# Patient Record
Sex: Female | Born: 1980 | Race: Black or African American | Hispanic: No | Marital: Single | Smoking: Former smoker
Health system: Southern US, Community
[De-identification: ages and names within clinical notes are randomized; demographics above are authoritative.]

## PROBLEM LIST (undated history)

## (undated) DIAGNOSIS — R5383 Other fatigue: Secondary | ICD-10-CM

## (undated) DIAGNOSIS — F329 Major depressive disorder, single episode, unspecified: Secondary | ICD-10-CM

## (undated) DIAGNOSIS — O09522 Supervision of elderly multigravida, second trimester: Secondary | ICD-10-CM

## (undated) DIAGNOSIS — R55 Syncope and collapse: Secondary | ICD-10-CM

## (undated) DIAGNOSIS — R0602 Shortness of breath: Secondary | ICD-10-CM

## (undated) DIAGNOSIS — Z3A16 16 weeks gestation of pregnancy: Secondary | ICD-10-CM

## (undated) DIAGNOSIS — S0990XS Unspecified injury of head, sequela: Secondary | ICD-10-CM

## (undated) DIAGNOSIS — R002 Palpitations: Secondary | ICD-10-CM

## (undated) DIAGNOSIS — F32A Depression, unspecified: Secondary | ICD-10-CM

## (undated) DIAGNOSIS — G44309 Post-traumatic headache, unspecified, not intractable: Secondary | ICD-10-CM

## (undated) HISTORY — PX: HERNIA REPAIR: SHX51

## (undated) HISTORY — DX: Depression, unspecified: F32.A

## (undated) HISTORY — DX: Other fatigue: R53.83

## (undated) HISTORY — DX: Major depressive disorder, single episode, unspecified: F32.9

## (undated) HISTORY — DX: Syncope and collapse: R55

## (undated) HISTORY — DX: Shortness of breath: R06.02

## (undated) HISTORY — DX: Palpitations: R00.2

## (undated) HISTORY — DX: Unspecified injury of head, sequela: S09.90XS

## (undated) HISTORY — DX: Supervision of elderly multigravida, second trimester: O09.522

## (undated) HISTORY — DX: Unspecified injury of head, sequela: G44.309

## (undated) HISTORY — DX: 16 weeks gestation of pregnancy: Z3A.16

---

## 2015-08-26 ENCOUNTER — Encounter (HOSPITAL_COMMUNITY): Payer: Self-pay | Admitting: *Deleted

## 2015-08-26 ENCOUNTER — Emergency Department (INDEPENDENT_AMBULATORY_CARE_PROVIDER_SITE_OTHER)
Admission: EM | Admit: 2015-08-26 | Discharge: 2015-08-26 | Disposition: A | Discharge status: Home / Self Care | Payer: Medicaid Other | Source: Home / Self Care | Attending: Family Medicine | Admitting: Family Medicine

## 2015-08-26 DIAGNOSIS — K529 Noninfective gastroenteritis and colitis, unspecified: Secondary | ICD-10-CM

## 2015-08-26 LAB — POCT URINALYSIS DIP (DEVICE)
Bilirubin Urine: NEGATIVE
GLUCOSE, UA: NEGATIVE mg/dL
Hgb urine dipstick: NEGATIVE
KETONES UR: NEGATIVE mg/dL
Nitrite: NEGATIVE
PROTEIN: NEGATIVE mg/dL
SPECIFIC GRAVITY, URINE: 1.025 (ref 1.005–1.030)
UROBILINOGEN UA: 2 mg/dL — AB (ref 0.0–1.0)
pH: 6.5 (ref 5.0–8.0)

## 2015-08-26 LAB — POCT PREGNANCY, URINE: PREG TEST UR: NEGATIVE

## 2015-08-26 MED ORDER — ONDANSETRON 4 MG PO TBDP
4.0000 mg | ORAL_TABLET | Freq: Once | ORAL | Status: AC
  Administered 2015-08-26: 4 mg via ORAL

## 2015-08-26 MED ORDER — PROMETHAZINE HCL 25 MG PO TABS: 25.0000 mg | ORAL_TABLET | Freq: Four times a day (QID) | ORAL | Status: DC | PRN

## 2015-08-26 MED ORDER — ONDANSETRON 4 MG PO TBDP
ORAL_TABLET | ORAL | Status: AC
  Filled 2015-08-26: qty 1

## 2015-08-26 NOTE — Discharge Instructions (Signed)
Clear liquid , bland diet tonight as tolerated, advance on fri as improved, use medicine as needed,imodium for diarrhea as needed, return or see your doctor if any problems.

## 2015-08-26 NOTE — ED Provider Notes (Signed)
CSN: [id number]     Arrival date & time 08/26/15  1634 History   First MD Initiated Contact with Patient 08/26/15 1718     Chief Complaint  Patient presents with  . Nausea   (Consider location/radiation/quality/duration/timing/severity/associated sxs/prior Treatment) Patient is a 34 y.o. female presenting with vomiting. The history is provided by the patient.  Emesis Severity:  Mild Duration:  1 day Number of daily episodes:  2 Quality:  Stomach contents Progression:  Partially resolved Chronicity:  New Worsened by:  Nothing tried Ineffective treatments:  None tried Associated symptoms: no abdominal pain, no chills, no diarrhea and no fever   Risk factors: sick contacts     History reviewed. No pertinent past medical history. History reviewed. No pertinent past surgical history. No family history on file. Social History  Substance Use Topics  . Smoking status: Current Some Day Smoker  . Smokeless tobacco: None  . Alcohol Use: Yes   OB History    No data available     Review of Systems  Constitutional: Negative.  Negative for fever and chills.  HENT: Negative.   Cardiovascular: Negative.   Gastrointestinal: Positive for nausea and vomiting. Negative for abdominal pain, diarrhea and blood in stool.    Allergies  Review of patient's allergies indicates no known allergies.  Home Medications   Prior to Admission medications   Medication Sig Start Date End Date Taking? Authorizing Provider  promethazine (PHENERGAN) 25 MG tablet Take 1 tablet (25 mg total) by mouth every 6 (six) hours as needed for nausea or vomiting. 08/26/15   Linna Hoff, MD   Meds Ordered and Administered this Visit   Medications  ondansetron (ZOFRAN-ODT) disintegrating tablet 4 mg (4 mg Oral Given 08/26/15 1752)    BP 128/96 mmHg  Pulse 84  Temp(Src) 98 F (36.7 C) (Oral)  Resp 16  SpO2 100%  LMP  No data found.   Physical Exam  Constitutional: She is oriented to person, place, and  time. She appears well-developed and well-nourished. No distress.  HENT:  Mouth/Throat: Oropharynx is clear and moist.  Neck: Normal range of motion. Neck supple.  Cardiovascular: Normal heart sounds.   Abdominal: Soft. Bowel sounds are normal. She exhibits no distension and no mass. There is no tenderness. There is no rebound and no guarding.  Lymphadenopathy:    She has no cervical adenopathy.  Neurological: She is alert and oriented to person, place, and time.  Skin: Skin is warm and dry.  Nursing note and vitals reviewed.   ED Course  Procedures (including critical care time)  Labs Review Labs Reviewed  POCT URINALYSIS DIP (DEVICE) - Abnormal; Notable for the following:    Urobilinogen, UA 2.0 (*)    Leukocytes, UA SMALL (*)    All other components within normal limits  POCT PREGNANCY, URINE    Imaging Review No results found.   Visual Acuity Review  Right Eye Distance:   Left Eye Distance:   Bilateral Distance:    Right Eye Near:   Left Eye Near:    Bilateral Near:         MDM   1. Gastroenteritis, acute       Linna Hoff, MD 08/27/15 [phone]

## 2015-08-26 NOTE — ED Notes (Signed)
Pt  Reports   Symptoms  Of      Nausea       She  Is  Late  On  Her   Cycle          Child  Has  Diarrhea   With onset  Today

## 2015-11-17 ENCOUNTER — Emergency Department (INDEPENDENT_AMBULATORY_CARE_PROVIDER_SITE_OTHER)
Admission: EM | Admit: 2015-11-17 | Discharge: 2015-11-17 | Disposition: A | Discharge status: Home / Self Care | Payer: Medicaid Other | Source: Home / Self Care | Attending: Emergency Medicine | Admitting: Emergency Medicine

## 2015-11-17 ENCOUNTER — Encounter (HOSPITAL_COMMUNITY): Payer: Self-pay | Admitting: Emergency Medicine

## 2015-11-17 DIAGNOSIS — K529 Noninfective gastroenteritis and colitis, unspecified: Secondary | ICD-10-CM | POA: Diagnosis not present

## 2015-11-17 DIAGNOSIS — Z331 Pregnant state, incidental: Secondary | ICD-10-CM | POA: Diagnosis not present

## 2015-11-17 DIAGNOSIS — Z3201 Encounter for pregnancy test, result positive: Secondary | ICD-10-CM | POA: Diagnosis not present

## 2015-11-17 DIAGNOSIS — Z3490 Encounter for supervision of normal pregnancy, unspecified, unspecified trimester: Secondary | ICD-10-CM

## 2015-11-17 LAB — POCT PREGNANCY, URINE: PREG TEST UR: POSITIVE — AB

## 2015-11-17 LAB — POCT URINALYSIS DIP (DEVICE)
Glucose, UA: NEGATIVE mg/dL
KETONES UR: NEGATIVE mg/dL
LEUKOCYTES UA: NEGATIVE
NITRITE: POSITIVE — AB
Protein, ur: 30 mg/dL — AB
Specific Gravity, Urine: >=1.03 (ref 1.005–1.030)
Urobilinogen, UA: 4 mg/dL — ABNORMAL HIGH (ref 0.0–1.0)
pH: 6 (ref 5.0–8.0)

## 2015-11-17 MED ORDER — ONDANSETRON HCL 4 MG PO TABS: 4.0000 mg | ORAL_TABLET | Freq: Three times a day (TID) | ORAL | Status: DC | PRN

## 2015-11-17 MED ORDER — CEPHALEXIN 500 MG PO CAPS: 500.0000 mg | ORAL_CAPSULE | Freq: Four times a day (QID) | ORAL | Status: DC

## 2015-11-17 MED ORDER — ONDANSETRON HCL 4 MG/2ML IJ SOLN
INTRAMUSCULAR | Status: AC
  Filled 2015-11-17: qty 2

## 2015-11-17 MED ORDER — ONDANSETRON HCL 4 MG/2ML IJ SOLN
4.0000 mg | Freq: Once | INTRAMUSCULAR | Status: AC
  Administered 2015-11-17: 4 mg via INTRAMUSCULAR

## 2015-11-17 NOTE — ED Notes (Signed)
Here with GI upset that started 4 days ago Nausea, vomiting and diarrhea Unable to keep food/fluids down

## 2015-11-17 NOTE — ED Provider Notes (Signed)
CSN: [id number]     Arrival date & time 11/17/15  1909 History   First MD Initiated Contact with Patient 11/17/15 1959     Chief Complaint  Patient presents with  . GI Problem   (Consider location/radiation/quality/duration/timing/severity/associated sxs/prior Treatment) HPI  She is a 34 year old woman here for evaluation of stomach bug. She states for the last 4 days she has had nausea, vomiting, and diarrhea. Symptoms do not seem to be improving. She is able to tolerate occasional food. Today, she has not been able to eat anything. She has been able to tolerate small sips of liquid. She denies abdominal pain. No blood in the vomit or stool. The diarrhea is described as watery and occurring at least 4-5 times a day. She denies any dizziness. Her daughter was sick with similar symptoms, but resolved within 24 hours.  History reviewed. No pertinent past medical history. History reviewed. No pertinent past surgical history. No family history on file. Social History  Substance Use Topics  . Smoking status: Current Some Day Smoker  . Smokeless tobacco: None  . Alcohol Use: Yes   OB History    No data available     Review of Systems As in history of present illness Allergies  Review of patient's allergies indicates no known allergies.  Home Medications   Prior to Admission medications   Medication Sig Start Date End Date Taking? Authorizing Provider  cephALEXin (KEFLEX) 500 MG capsule Take 1 capsule (500 mg total) by mouth 4 (four) times daily. 11/17/15   Charm Rings, MD  ondansetron (ZOFRAN) 4 MG tablet Take 1 tablet (4 mg total) by mouth every 8 (eight) hours as needed for nausea or vomiting. 11/17/15   Charm Rings, MD  promethazine (PHENERGAN) 25 MG tablet Take 1 tablet (25 mg total) by mouth every 6 (six) hours as needed for nausea or vomiting. 08/26/15   Linna Hoff, MD   Meds Ordered and Administered this Visit   Medications  ondansetron [facility]) injection 4 mg (4 mg  Intramuscular Given 11/17/15 2015)    BP 114/80 mmHg  Pulse 88  Temp(Src) 98.8 F (37.1 C) (Oral)  Resp 18  SpO2 99%  LMP 06/17/2015 No data found.   Physical Exam  Constitutional: She is oriented to person, place, and time. She appears well-developed and well-nourished. No distress.  HENT:  Lips slightly dry. Moist buccal mucosa.  Eyes: Conjunctivae are normal.  Neck: Neck supple.  Cardiovascular: Normal rate, regular rhythm and normal heart sounds.   No murmur heard. Pulmonary/Chest: Effort normal and breath sounds normal. No respiratory distress. She has no wheezes. She has no rales.  Abdominal: Soft. Bowel sounds are normal. She exhibits no distension. There is no tenderness. There is no rebound and no guarding.  Neurological: She is alert and oriented to person, place, and time.    ED Course  Procedures (including critical care time)  Labs Review Labs Reviewed  POCT URINALYSIS DIP (DEVICE) - Abnormal; Notable for the following:    Bilirubin Urine SMALL (*)    Hgb urine dipstick TRACE (*)    Protein, ur 30 (*)    Urobilinogen, UA 4.0 (*)    Nitrite POSITIVE (*)    All other components within normal limits  POCT PREGNANCY, URINE - Abnormal; Notable for the following:    Preg Test, Ur POSITIVE (*)    All other components within normal limits  URINE CULTURE    Imaging Review No results found.    MDM  1. Gastroenteritis   2. Pregnancy    Nausea is resolved after IM Zofran.  She is tolerating ginger ale well. Discussed positive pregnancy test. She does not wish to continue the pregnancy.  [facility] as well as referral for abortion services. Urine is concerning for infection. Will send for culture. Started on Keflex.    Charm RingsErin J Jeanmarc Viernes, MD 11/17/15 [phone]

## 2015-11-17 NOTE — Discharge Instructions (Signed)
You are pregnant. You also have a stomach bug. Take Zofran every 8 hours as needed for nausea or vomiting. You're drinking lots of fluids. [facility]. They will be able to figure out how far along you are and assist you in obtaining an abortion.

## 2015-11-28 ENCOUNTER — Encounter (HOSPITAL_COMMUNITY): Payer: Self-pay | Admitting: Nurse Practitioner

## 2015-11-28 ENCOUNTER — Emergency Department (HOSPITAL_COMMUNITY)
Admission: EM | Admit: 2015-11-28 | Discharge: 2015-11-28 | Disposition: A | Discharge status: Home / Self Care | Payer: Medicaid Other | Attending: Emergency Medicine | Admitting: Emergency Medicine

## 2015-11-28 ENCOUNTER — Emergency Department (HOSPITAL_COMMUNITY): Payer: Medicaid Other

## 2015-11-28 DIAGNOSIS — O021 Missed abortion: Secondary | ICD-10-CM | POA: Diagnosis not present

## 2015-11-28 DIAGNOSIS — O4690 Antepartum hemorrhage, unspecified, unspecified trimester: Secondary | ICD-10-CM

## 2015-11-28 DIAGNOSIS — Z3A01 Less than 8 weeks gestation of pregnancy: Secondary | ICD-10-CM | POA: Insufficient documentation

## 2015-11-28 DIAGNOSIS — Z792 Long term (current) use of antibiotics: Secondary | ICD-10-CM | POA: Diagnosis not present

## 2015-11-28 DIAGNOSIS — O209 Hemorrhage in early pregnancy, unspecified: Secondary | ICD-10-CM | POA: Diagnosis present

## 2015-11-28 DIAGNOSIS — O99331 Smoking (tobacco) complicating pregnancy, first trimester: Secondary | ICD-10-CM | POA: Insufficient documentation

## 2015-11-28 DIAGNOSIS — F17200 Nicotine dependence, unspecified, uncomplicated: Secondary | ICD-10-CM | POA: Insufficient documentation

## 2015-11-28 DIAGNOSIS — N939 Abnormal uterine and vaginal bleeding, unspecified: Secondary | ICD-10-CM

## 2015-11-28 LAB — URINALYSIS, ROUTINE W REFLEX MICROSCOPIC
Bilirubin Urine: NEGATIVE
Glucose, UA: NEGATIVE mg/dL
KETONES UR: 15 mg/dL — AB
NITRITE: NEGATIVE
PROTEIN: NEGATIVE mg/dL
Specific Gravity, Urine: 1.028 (ref 1.005–1.030)
pH: 6 (ref 5.0–8.0)

## 2015-11-28 LAB — CBC WITH DIFFERENTIAL/PLATELET
BASOS PCT: 0 %
Basophils Absolute: 0 10*3/uL (ref 0.0–0.1)
Eosinophils Absolute: 0.6 10*3/uL (ref 0.0–0.7)
Eosinophils Relative: 12 %
HEMATOCRIT: 33.7 % — AB (ref 36.0–46.0)
HEMOGLOBIN: 11.2 g/dL — AB (ref 12.0–15.0)
Lymphocytes Relative: 30 %
Lymphs Abs: 1.5 10*3/uL (ref 0.7–4.0)
MCH: 27.1 pg (ref 26.0–34.0)
MCHC: 33.2 g/dL (ref 30.0–36.0)
MCV: 81.4 fL (ref 78.0–100.0)
MONO ABS: 0.3 10*3/uL (ref 0.1–1.0)
Monocytes Relative: 6 %
NEUTROS ABS: 2.6 10*3/uL (ref 1.7–7.7)
NEUTROS PCT: 52 %
Platelets: 283 10*3/uL (ref 150–400)
RBC: 4.14 MIL/uL (ref 3.87–5.11)
RDW: 14 % (ref 11.5–15.5)
WBC: 4.9 10*3/uL (ref 4.0–10.5)

## 2015-11-28 LAB — URINE MICROSCOPIC-ADD ON

## 2015-11-28 LAB — WET PREP, GENITAL
Sperm: NONE SEEN
Trich, Wet Prep: NONE SEEN
Yeast Wet Prep HPF POC: NONE SEEN

## 2015-11-28 LAB — HCG, QUANTITATIVE, PREGNANCY: hCG, Beta Chain, Quant, S: 5385 m[IU]/mL — ABNORMAL HIGH (ref <5)

## 2015-11-28 LAB — ABO/RH: ABO/RH(D): O POS

## 2015-11-28 LAB — I-STAT BETA HCG BLOOD, ED (MC, WL, AP ONLY): I-stat hCG, quantitative: >2000 m[IU]/mL — ABNORMAL HIGH (ref <5)

## 2015-11-28 MED ORDER — ACETAMINOPHEN 500 MG PO TABS: 500.0000 mg | ORAL_TABLET | Freq: Four times a day (QID) | ORAL | Status: DC | PRN

## 2015-11-28 NOTE — ED Notes (Signed)
US called, patient on the way back now.

## 2015-11-28 NOTE — ED Notes (Signed)
Pt had confirmed pregnancy here on 12/28, she has been unable to f/u with OBGYn yet due to insurance issues. She reports vaginal spotting increasing over past 2 days, today she had to wear a pad because the bleeding became heavier, she denies pain. She reports her last period was in July but she had negative pregnancy test in October, so she is unsure how far along she is.

## 2015-11-28 NOTE — ED Notes (Signed)
Pelvic cart set up and at bedside. 

## 2015-11-28 NOTE — Discharge Instructions (Signed)
Miscarriage °A miscarriage is the sudden loss of an unborn baby (fetus) before the 20th week of pregnancy. Most miscarriages happen in the first 3 months of pregnancy. Sometimes, it happens before a woman even knows she is pregnant. A miscarriage is also called a "spontaneous miscarriage" or "early pregnancy loss." Having a miscarriage can be an emotional experience. Talk with your caregiver about any questions you may have about miscarrying, the grieving process, and your future pregnancy plans. °CAUSES  °· Problems with the fetal chromosomes that make it impossible for the baby to develop normally. Problems with the baby's genes or chromosomes are most often the result of errors that occur, by chance, as the embryo divides and grows. The problems are not inherited from the parents. °· Infection of the cervix or uterus.   °· Hormone problems.   °· Problems with the cervix, such as having an incompetent cervix. This is when the tissue in the cervix is not strong enough to hold the pregnancy.   °· Problems with the uterus, such as an abnormally shaped uterus, uterine fibroids, or congenital abnormalities.   °· Certain medical conditions.   °· Smoking, drinking alcohol, or taking illegal drugs.   °· Trauma.   °Often, the cause of a miscarriage is unknown.  °SYMPTOMS  °· Vaginal bleeding or spotting, with or without cramps or pain. °· Pain or cramping in the abdomen or lower back. °· Passing fluid, tissue, or blood clots from the vagina. °DIAGNOSIS  °Your caregiver will perform a physical exam. You may also have an ultrasound to confirm the miscarriage. Blood or urine tests may also be ordered. °TREATMENT  °· Sometimes, treatment is not necessary if you naturally pass all the fetal tissue that was in the uterus. If some of the fetus or placenta remains in the body (incomplete miscarriage), tissue left behind may become infected and must be removed. Usually, a dilation and curettage (D and C) procedure is performed.  During a D and C procedure, the cervix is widened (dilated) and any remaining fetal or placental tissue is gently removed from the uterus. °· Antibiotic medicines are prescribed if there is an infection. Other medicines may be given to reduce the size of the uterus (contract) if there is a lot of bleeding. °· If you have Rh negative blood and your baby was Rh positive, you will need a Rh immunoglobulin shot. This shot will protect any future baby from having Rh blood problems in future pregnancies. °HOME CARE INSTRUCTIONS  °· Your caregiver may order bed rest or may allow you to continue light activity. Resume activity as directed by your caregiver. °· Have someone help with home and family responsibilities during this time.   °· Keep track of the number of sanitary pads you use each day and how soaked (saturated) they are. Write down this information.   °· Do not use tampons. Do not douche or have sexual intercourse until approved by your caregiver.   °· Only take over-the-counter or prescription medicines for pain or discomfort as directed by your caregiver.   °· Do not take aspirin. Aspirin can cause bleeding.   °· Keep all follow-up appointments with your caregiver.   °· If you or your partner have problems with grieving, talk to your caregiver or seek counseling to help cope with the pregnancy loss. Allow enough time to grieve before trying to get pregnant again.   °SEEK IMMEDIATE MEDICAL CARE IF:  °· You have severe cramps or pain in your back or abdomen. °· You have a fever. °· You pass large blood clots (walnut-sized   or larger) ortissue from your vagina. Save any tissue for your caregiver to inspect.   Your bleeding increases.   You have a thick, bad-smelling vaginal discharge.  You become lightheaded, weak, or you faint.   You have chills.  MAKE SURE YOU:  Understand these instructions.  Will watch your condition.  Will get help right away if you are not doing well or get worse.   This  information is not intended to replace advice given to you by your health care provider. Make sure you discuss any questions you have with your health care provider.   Document Released: 05/02/2001 Document Revised: 03/03/2013 Document Reviewed: 12/26/2011 Elsevier Interactive Patient Education 2016 [employer]  Emergency Department Resource Guide 1) Find a Doctor and Pay Out of Pocket Although you won't have to find out who is covered by your insurance plan, it is a good idea to ask around and get recommendations. You will then need to call the office and see if the doctor you have chosen will accept you as a new patient and what types of options they offer for patients who are self-pay. Some doctors offer discounts or will set up payment plans for their patients who do not have insurance, but you will need to ask so you aren't surprised when you get to your appointment.  2) Contact Your Local Health Department Not all health departments have doctors that can see patients for sick visits, but many do, so it is worth a call to see if yours does. If you don't know where your local health department is, you can check in your phone book. The CDC also has a tool to help you locate your state's health department, and many state websites also have listings of all of their local health departments.  3) Find a Walk-in Clinic If your illness is not likely to be very severe or complicated, you may want to try a walk in clinic. These are popping up all over the country in pharmacies, drugstores, and shopping centers. They're usually staffed by nurse practitioners or physician assistants that have been trained to treat common illnesses and complaints. They're usually fairly quick and inexpensive. However, if you have serious medical issues or chronic medical problems, these are probably not your best option.  No Primary Care Doctor: - Call Health Connect at  [phone] - they can help you locate a primary care  doctor that  accepts your insurance, provides certain services, etc. - Physician Referral Service- [phone]  Chronic Pain Problems: Organization         Address  Phone   Notes  [facility]  [phone] Patients need to be referred by their primary care doctor.   Medication Assistance: Organization         Address  Phone   Notes  [facility] Medication [facility] [address] [place], [place] [address] [phone] --Must be a resident of [facility] -- Must have NO insurance coverage whatsoever (no Medicaid/ Medicare, etc.) -- The pt. MUST have a primary care doctor that directs their care regularly and follows them in the community   MedAssist  [phone]   [employer]  [phone]    Agencies that provide inexpensive medical care: Organization         Address  Phone   Notes  [facility]  [phone]) [phone]   [facility]    [phone]   [facility] [address] [place], [place] [address] [phone]) [phone]   [facility] [place] [address], [place] [phone]) [phone]   [facility] Parenthood    [phone]   [facility]    [phone]   [facility]  [address], [place] Phone:  [phone], Fax:  [phone] Hours of Operation:  9 am - 6 pm, M-F.  Also accepts Medicaid/Medicare and self-pay.  [facility]  [address], [place] Phone: [phone], Fax: [phone]. Hours of Operation:  8:30 am - 5:30 pm, M-F.  Also accepts Medicaid and self-pay.  [facility] [place] [address], [place] Phone: [phone]   [facility] [address], [place], [place] [phone], Ext. [phone] Mondays & Thursdays: 7-9 AM.  First 15 patients are seen on a first come, first serve basis.    Medicaid-accepting [facility]  Providers:  Organization         Address  Phone   Notes  [facility] [address], [place] [phone] Also accepts self-pay patients.  [facility] [address], [place]  [phone]   [facility] [address], [place] [phone]   [facility] [address] [place] [address], [place] [phone]) [phone]   Renaye RakersVeita Bland [address], [place]   [phone] Only accepts [place] patients after they have their name applied to their card.   Self-Pay (no insurance) in [facility] [place]:  Organization         Address  Phone   Notes  Sickle Cell Patients, [facility] [address], [place] [phone]   [facility] [address], [place] [phone]   [facility]  [address], [place] [phone]   [facility]/Dr. Osei-Bonsu  [address] [place] [address], [place] or [id number], [place] [phone] Phone number for both [place] and [place] locations is the same.  [facility] [address], [place] [phone]) [phone]   [facility] [place] [address] [place] [address], [place] or [address] [phone]   [facility] [address], [place] [phone], phone; [phone], fax Sees patients 1st and 3rd Saturday of every month.  Must not qualify for public or private insurance (i.e. Medicaid, Medicare, [place] Health Choice, Veterans' Benefits)  Household income should be no more than 200% of the poverty level The clinic cannot treat you if you are pregnant or think you are pregnant  Sexually transmitted diseases are not treated at the clinic.    Dental Care: Organization         Address  Phone  Notes  [facility] Department of [facility] [address], [place] [phone] Accepts children up to age 36 who are enrolled in [place] or [place] Health Choice; pregnant women with a Medicaid card; and children who have applied for Medicaid or [place] Health Choice, but were declined, whose parents can pay a reduced fee at time of service.  [facility] Department of [facility] [place]  [address], [place] [phone]) [phone] Accepts children up to age 36 who are enrolled in  Medicaid or [place] Health Choice; pregnant women with a Medicaid card; and children who have applied for Medicaid or [place] Health Choice, but were declined, whose parents can pay a reduced fee at time of service.  [facility]  [address] [place], [place] [phone] Patients are seen by appointment only. Walk-ins are not accepted. [facility] will see patients 44 years of age and older. Monday - Tuesday (8am-5pm) Most Wednesdays (8:30-5pm) $30 per visit, cash only  [facility]  [address], [facility] [phone] Patients are seen by appointment only. Walk-ins are not accepted. [facility] will see patients 40 years of age and older. One Wednesday Evening (Monthly: Volunteer Based).  $30 per visit, cash only  [employer]  [phone] for adults; Children under age 15, call Graduate Pediatric Dentistry at [phone]. Children aged 57-14, please call [phone] to request a pediatric application.  Dental services are provided in all areas of dental care including fillings, crowns and bridges, complete and partial dentures, implants, gum treatment, root canals, and extractions. Preventive care is also provided. Treatment is provided to both adults and children. Patients are selected via a lottery and there is often a waiting list.   [facility] [address] [place]  [phone] [email or web address]   [facility]  [address] [place], [place] [phone], Ext. [phone] Second and Fourth Thursday of each month, opens at 6:30 AM; Clinic ends at 9 AM.  Patients are seen on a first-come first-served basis, and a limited number are seen during each clinic.   [facility]  [address] [place], [place] [phone]   Eligibility Requirements You must have lived in [place], [place], or [place] counties for at least the last three months.   You cannot be eligible for state or federal sponsored [place], including [employer], [place], or [employer].   You generally cannot be eligible for healthcare insurance through your employer.    How to apply: Eligibility screenings are held every Tuesday and Wednesday afternoon from 1:00 pm until 4:00 pm. You do not need an appointment for the interview!  [facility] [address] [place], [place] [phone]   [facility]  [phone]   [facility]  [phone]   [facility]  [phone]

## 2015-11-28 NOTE — ED Provider Notes (Signed)
CSN: [id number]     Arrival date & time 11/28/15  1747 History   First MD Initiated Contact with Patient 11/28/15 2008     Chief Complaint  Patient presents with  . Vaginal Bleeding     (Consider location/radiation/quality/duration/timing/severity/associated sxs/prior Treatment) HPI Comments: Patient is a [age] y/o [id number] female, currently pregnant with LMP July 2016. She presents to the emergency department for evaluation of vaginal bleeding and discharge. She states that she has been spotting some [place] discharge with blood over the past 2 days. She feels as though her spotting has become heavier over the past 24 hours. She has been wearing a pad, but has not been soaking through a full pad in a 24-hour period. She has had no abdominal pain, fever, urinary symptoms. Abdominal surgical hx notable for hernia repair and C-section. No other c/o for this visit. Patient has not seen an OBGYN in the area since learning of her pregnancy on 11/17/15.  Patient is a 35 y.o. female presenting with vaginal bleeding. The history is provided by the patient. No language interpreter was used.  Vaginal Bleeding Associated symptoms: vaginal discharge   Associated symptoms: no abdominal pain, no dysuria and no fever     History reviewed. No pertinent past medical history. Past Surgical History  Procedure Laterality Date  . Hernia repair    . Cesarean section     History reviewed. No pertinent family history. Social History  Substance Use Topics  . Smoking status: Current Some Day Smoker  . Smokeless tobacco: None  . Alcohol Use: Yes   OB History    No data available      Review of Systems  Constitutional: Negative for fever.  Gastrointestinal: Negative for vomiting, abdominal pain and diarrhea.  Genitourinary: Positive for vaginal bleeding and vaginal discharge. Negative for dysuria.  All other systems reviewed and are negative.   Allergies  Review of patient's allergies indicates no known  allergies.  Home Medications   Prior to Admission medications   Medication Sig Start Date End Date Taking? Authorizing Provider  cephALEXin (KEFLEX) 500 MG capsule Take 1 capsule (500 mg total) by mouth 4 (four) times daily. 11/17/15   Charm RingsErin J Honig, MD  ondansetron (ZOFRAN) 4 MG tablet Take 1 tablet (4 mg total) by mouth every 8 (eight) hours as needed for nausea or vomiting. 11/17/15   Charm RingsErin J Honig, MD  promethazine (PHENERGAN) 25 MG tablet Take 1 tablet (25 mg total) by mouth every 6 (six) hours as needed for nausea or vomiting. 08/26/15   Linna HoffJames D Kindl, MD   BP 123/86 mmHg  Pulse 79  Temp(Src) 98 F (36.7 C) (Oral)  Resp 16  SpO2 100%  LMP 06/17/2015   Physical Exam  Constitutional: She is oriented to person, place, and time. She appears well-developed and well-nourished. No distress.  Nontoxic/nonseptic appearing  HENT:  Head: Normocephalic and atraumatic.  Eyes: Conjunctivae and EOM are normal. No scleral icterus.  Neck: Normal range of motion.  Cardiovascular: Normal rate, regular rhythm and intact distal pulses.   Pulmonary/Chest: Effort normal and breath sounds normal. No respiratory distress. She has no wheezes. She has no rales.  Respirations even and unlabored  Abdominal: Soft. She exhibits no distension. There is no tenderness. There is no rebound and no guarding.  Soft abdomen without masses or peritoneal signs.  Genitourinary: There is no rash, tenderness, lesion or injury on the right labia. There is no rash, tenderness, lesion or injury on the left labia. Uterus is  not tender. Cervix exhibits no motion tenderness. Vaginal discharge (mild/moderate Dimperio and bloody. No clots) found.  Musculoskeletal: Normal range of motion.  Neurological: She is alert and oriented to person, place, and time. She exhibits normal muscle tone. Coordination normal.  Skin: Skin is warm and dry. No rash noted. She is not diaphoretic. No erythema. No pallor.  Psychiatric: She has a normal mood  and affect. Her behavior is normal.  Nursing note and vitals reviewed.   ED Course  Procedures (including critical care time) Labs Review Labs Reviewed  WET PREP, GENITAL - Abnormal; Notable for the following:    Clue Cells Wet Prep HPF POC PRESENT (*)    WBC, Wet Prep HPF POC MANY (*)    All other components within normal limits  CBC WITH DIFFERENTIAL/PLATELET - Abnormal; Notable for the following:    Hemoglobin 11.2 (*)    HCT 33.7 (*)    All other components within normal limits  HCG, QUANTITATIVE, PREGNANCY - Abnormal; Notable for the following:    hCG, Beta Chain, Quant, S 5385 (*)    All other components within normal limits  URINALYSIS, ROUTINE W REFLEX MICROSCOPIC (NOT AT [facility]) - Abnormal; Notable for the following:    APPearance CLOUDY (*)    Hgb urine dipstick MODERATE (*)    Ketones, ur 15 (*)    Leukocytes, UA SMALL (*)    All other components within normal limits  URINE MICROSCOPIC-ADD ON - Abnormal; Notable for the following:    Squamous Epithelial / LPF 6-30 (*)    Bacteria, UA FEW (*)    All other components within normal limits  I-STAT BETA HCG BLOOD, ED ([facility], [facility], [facility] ONLY) - Abnormal; Notable for the following:    I-stat hCG, quantitative >2000.0 (*)    All other components within normal limits  ABO/RH  GC/CHLAMYDIA PROBE AMP ([place]) NOT AT [facility]    Imaging Review US Ob Comp Less 14 Wks  11/28/2015  CLINICAL DATA:  35 year old fragment female with vaginal bleeding EXAM: OBSTETRIC <14 WK [place] AND TRANSVAGINAL OB US TECHNIQUE: Both transabdominal and transvaginal ultrasound examinations were performed for complete evaluation of the gestation as well as the maternal uterus, adnexal regions, and pelvic cul-de-sac. Transvaginal technique was performed to assess early pregnancy. COMPARISON:  None. FINDINGS: Intrauterine gestational sac: Single intrauterine gestational sac Yolk sac:  Not seen Embryo:  Present Cardiac Activity: Not detected Heart Rate: NA CRL:  12  mm    7 w   3 d                  [place] EDC: 07/13/2016 Subchorionic hemorrhage:  Small Maternal uterus/adnexae: There is a 2.4 x 2.0 x 2.4 cm right ovarian cyst. The left ovary appears unremarkable. No significant free fluid within the pelvis. IMPRESSION: Single intrauterine pregnancy with an estimated gestational age of [redacted] weeks, 3 days. No fetal cardiac activity identified. These results were called by telephone at the time of interpretation on 11/28/2015 at 10:13 pm to Dr. Anitra Lauth, who verbally acknowledged these results. Electronically Signed   By: Elgie Collard M.D.   On: 11/28/2015 22:15   US Ob Transvaginal  11/28/2015  CLINICAL DATA:  35 year old fragment female with vaginal bleeding EXAM: OBSTETRIC <14 WK [place] AND TRANSVAGINAL OB US TECHNIQUE: Both transabdominal and transvaginal ultrasound examinations were performed for complete evaluation of the gestation as well as the maternal uterus, adnexal regions, and pelvic cul-de-sac. Transvaginal technique was performed to assess early pregnancy. COMPARISON:  None.  FINDINGS: Intrauterine gestational sac: Single intrauterine gestational sac Yolk sac:  Not seen Embryo:  Present Cardiac Activity: Not detected Heart Rate: NA CRL:  12  mm   7 w   3 d                  [place] EDC: 07/13/2016 Subchorionic hemorrhage:  Small Maternal uterus/adnexae: There is a 2.4 x 2.0 x 2.4 cm right ovarian cyst. The left ovary appears unremarkable. No significant free fluid within the pelvis. IMPRESSION: Single intrauterine pregnancy with an estimated gestational age of [redacted] weeks, 3 days. No fetal cardiac activity identified. These results were called by telephone at the time of interpretation on 11/28/2015 at 10:13 pm to Dr. Anitra Lauth, who verbally acknowledged these results. Electronically Signed   By: Elgie Collard M.D.   On: 11/28/2015 22:15     I have personally reviewed and evaluated these images and lab results as part of my medical decision-making.   EKG Interpretation None        10:38 PM Spoke with Dr. Jolayne Panther of Faculty Practice at [facility] about [redacted]w[redacted]d gestation without cardiac activity with associated vaginal bleeding. Dr. Jolayne Panther comfortable with outpatient management; requests the patient call the [facility] to schedule and appointment within 2 weeks.   MDM   Final diagnoses:  Missed abortion  Vaginal bleeding    35 year old female presents to the emergency department for evaluation of vaginal bleeding and pregnancy. Abdomen is soft and nontender. Patient denies abdominal pain. She is afebrile. She has no leukocytosis or significant anemia. Ultrasound obtained which shows a 7 week 3 day gestation intrauterine without evidence of cardiac activity. Findings consistent with missed abortion. Consulted with [facility] faculty practice who recommend that the patient follow-up with them within 2 weeks. Results of ultrasound provided to the patient as well as instructions for follow-up and return precautions. Patient discharged in satisfactory condition; VSS.   Filed Vitals:   11/28/15 1841 11/28/15 2247 11/28/15 2248 11/28/15 2300  BP: 123/86 115/84  107/91  Pulse: 79  80 87  Temp: 98 F (36.7 C)     TempSrc: Oral     Resp: 16     SpO2: 100%  100% 100%     Antony Madura, PA-C 11/28/15 1610  Gwyneth Sprout, MD 12/02/15 2208

## 2015-11-29 LAB — GC/CHLAMYDIA PROBE AMP (~~LOC~~) NOT AT ARMC
Chlamydia: NEGATIVE
Neisseria Gonorrhea: NEGATIVE

## 2015-12-14 ENCOUNTER — Encounter: Payer: Self-pay | Admitting: *Deleted

## 2015-12-23 ENCOUNTER — Encounter: Payer: Self-pay | Admitting: Obstetrics and Gynecology

## 2015-12-23 ENCOUNTER — Ambulatory Visit (INDEPENDENT_AMBULATORY_CARE_PROVIDER_SITE_OTHER): Payer: Medicaid Other | Admitting: Obstetrics and Gynecology

## 2015-12-23 VITALS — BP 114/87 | HR 73 | Temp 97.8°F | Wt 173.1 lb

## 2015-12-23 DIAGNOSIS — O039 Complete or unspecified spontaneous abortion without complication: Secondary | ICD-10-CM | POA: Diagnosis not present

## 2015-12-23 MED ORDER — NORELGESTROMIN-ETH ESTRADIOL 150-35 MCG/24HR TD PTWK: 1.0000 | MEDICATED_PATCH | TRANSDERMAL | Status: DC

## 2015-12-23 NOTE — Progress Notes (Signed)
Patient ID: Morgan Lee, female   DOB: [birth year], 35 y.o.   MRN: [id number] 35 yo presenting today as an ED follow up. Patient was diagnosed with a missed abortion on 11/28/2015. She reports vaginal bleeding for 5 days. She is currently without complaints and denies abnormal vaginal bleeding or pelvic/abdominal pain  No past medical history on file. Past Surgical History  Procedure Laterality Date  . Hernia repair    . Cesarean section     No family history on file. Social History  Substance Use Topics  . Smoking status: Current Some Day Smoker  . Smokeless tobacco: Not on file  . Alcohol Use: Yes   ROS See pertinent in HPI  GENERAL: Well-developed, well-nourished female in no acute distress.  ABDOMEN: Soft, nontender, nondistended.  PELVIC: Normal external female genitalia. Vagina is pink and rugated.  Normal discharge. Normal appearing cervix. Uterus is normal in size. No adnexal mass or tenderness. EXTREMITIES: No cyanosis, clubbing, or edema, 2+ distal pulses.  11/28/2015 ultrasound FINDINGS: Intrauterine gestational sac: Single intrauterine gestational sac  Yolk sac: Not seen  Embryo: Present  Cardiac Activity: Not detected  Heart Rate: NA  CRL: 12 mm 7 w 3 d [place] EDC: 07/13/2016  Subchorionic hemorrhage: Small  Maternal uterus/adnexae: There is a 2.4 x 2.0 x 2.4 cm right ovarian cyst. The left ovary appears unremarkable. No significant free fluid within the pelvis.  IMPRESSION: Single intrauterine pregnancy with an estimated gestational age of [redacted] weeks, 3 days. No fetal cardiac activity identified.  A/P 35 yo with miscarriage in first trimester - quant HCG today and weekly until negative - This was not a planned pregnancy and patient is interested in resuming ortho evra - RTC prn

## 2015-12-24 ENCOUNTER — Telehealth: Payer: Self-pay

## 2015-12-24 LAB — HCG, QUANTITATIVE, PREGNANCY

## 2015-12-24 NOTE — Telephone Encounter (Signed)
Per Dr.Constant, Please inform the patient of complete resolution of pregnancy.

## 2015-12-27 NOTE — Telephone Encounter (Signed)
Called pt and informed her that her lab test shows complete resolution of her recent pregnancy. She may begin using her birth control as prescribed.  Pt voiced understanding.

## 2016-03-16 ENCOUNTER — Encounter (HOSPITAL_COMMUNITY): Payer: Self-pay | Admitting: Emergency Medicine

## 2016-03-16 DIAGNOSIS — N390 Urinary tract infection, site not specified: Secondary | ICD-10-CM | POA: Insufficient documentation

## 2016-03-16 DIAGNOSIS — Z3202 Encounter for pregnancy test, result negative: Secondary | ICD-10-CM | POA: Insufficient documentation

## 2016-03-16 DIAGNOSIS — F17210 Nicotine dependence, cigarettes, uncomplicated: Secondary | ICD-10-CM | POA: Diagnosis not present

## 2016-03-16 DIAGNOSIS — R1030 Lower abdominal pain, unspecified: Secondary | ICD-10-CM | POA: Diagnosis present

## 2016-03-16 LAB — CBC
HCT: 32.3 % — ABNORMAL LOW (ref 36.0–46.0)
HEMOGLOBIN: 10.2 g/dL — AB (ref 12.0–15.0)
MCH: 23.2 pg — ABNORMAL LOW (ref 26.0–34.0)
MCHC: 31.6 g/dL (ref 30.0–36.0)
MCV: 73.4 fL — ABNORMAL LOW (ref 78.0–100.0)
Platelets: 261 10*3/uL (ref 150–400)
RBC: 4.4 MIL/uL (ref 3.87–5.11)
RDW: 15.3 % (ref 11.5–15.5)
WBC: 5.6 10*3/uL (ref 4.0–10.5)

## 2016-03-16 LAB — URINALYSIS, ROUTINE W REFLEX MICROSCOPIC
BILIRUBIN URINE: NEGATIVE
Glucose, UA: NEGATIVE mg/dL
Hgb urine dipstick: NEGATIVE
KETONES UR: 15 mg/dL — AB
NITRITE: NEGATIVE
Protein, ur: NEGATIVE mg/dL
SPECIFIC GRAVITY, URINE: 1.031 — AB (ref 1.005–1.030)
pH: 6 (ref 5.0–8.0)

## 2016-03-16 LAB — URINE MICROSCOPIC-ADD ON: RBC / HPF: NONE SEEN RBC/hpf (ref 0–5)

## 2016-03-16 LAB — COMPREHENSIVE METABOLIC PANEL
ALBUMIN: 3.4 g/dL — AB (ref 3.5–5.0)
ALK PHOS: 87 U/L (ref 38–126)
ALT: 38 U/L (ref 14–54)
ANION GAP: 9 (ref 5–15)
AST: 40 U/L (ref 15–41)
BILIRUBIN TOTAL: 0.3 mg/dL (ref 0.3–1.2)
BUN: 7 mg/dL (ref 6–20)
CALCIUM: 8.9 mg/dL (ref 8.9–10.3)
CO2: 25 mmol/L (ref 22–32)
Chloride: 104 mmol/L (ref 101–111)
Creatinine, Ser: 0.66 mg/dL (ref 0.44–1.00)
GFR calc Af Amer: >60 mL/min (ref >60)
GLUCOSE: 103 mg/dL — AB (ref 65–99)
Potassium: 3.6 mmol/L (ref 3.5–5.1)
Sodium: 138 mmol/L (ref 135–145)
TOTAL PROTEIN: 7.4 g/dL (ref 6.5–8.1)

## 2016-03-16 LAB — LIPASE, BLOOD: Lipase: 30 U/L (ref 11–51)

## 2016-03-16 LAB — POC URINE PREG, ED: PREG TEST UR: NEGATIVE

## 2016-03-16 NOTE — ED Notes (Signed)
Pt. reports hypogastric pain onset yesterday , denies nausea or vomitting , no fever or diarrhea .

## 2016-03-17 ENCOUNTER — Emergency Department (HOSPITAL_COMMUNITY)
Admission: EM | Admit: 2016-03-17 | Discharge: 2016-03-17 | Disposition: A | Discharge status: Home / Self Care | Payer: Medicaid Other | Attending: Emergency Medicine | Admitting: Emergency Medicine

## 2016-03-17 DIAGNOSIS — N390 Urinary tract infection, site not specified: Secondary | ICD-10-CM

## 2016-03-17 MED ORDER — PHENAZOPYRIDINE HCL 100 MG PO TABS
200.0000 mg | ORAL_TABLET | Freq: Once | ORAL | Status: AC
  Administered 2016-03-17: 200 mg via ORAL
  Filled 2016-03-17: qty 2

## 2016-03-17 MED ORDER — CEPHALEXIN 250 MG/5ML PO SUSR: 1000.0000 mg | Freq: Two times a day (BID) | ORAL | Status: AC

## 2016-03-17 MED ORDER — TRAMADOL HCL 50 MG PO TABS: 50.0000 mg | ORAL_TABLET | Freq: Four times a day (QID) | ORAL | Status: DC | PRN

## 2016-03-17 MED ORDER — PHENAZOPYRIDINE HCL 200 MG PO TABS: 200.0000 mg | ORAL_TABLET | Freq: Three times a day (TID) | ORAL | Status: DC | PRN

## 2016-03-17 MED ORDER — CEPHALEXIN 250 MG PO CAPS
1000.0000 mg | ORAL_CAPSULE | Freq: Once | ORAL | Status: AC
  Administered 2016-03-17: 1000 mg via ORAL
  Filled 2016-03-17: qty 4

## 2016-03-17 MED ORDER — CEPHALEXIN 500 MG PO CAPS: 1000.0000 mg | ORAL_CAPSULE | Freq: Two times a day (BID) | ORAL | Status: DC

## 2016-03-17 NOTE — ED Provider Notes (Signed)
CSN: [id number]     Arrival date & time 03/16/16  2025 History  By signing my name below, I, Morgan Lee, attest that this documentation has been prepared under the direction and in the presence of Morgan Creasehristopher J Tyjae Shvartsman, MD . Electronically Signed: Freida Busmaniana Lee, Scribe. 03/17/2016. 1:03 AM.    Chief Complaint  Patient presents with  . Abdominal Pain   The history is provided by the patient. No language interpreter was used.   HPI Comments:  Morgan Lee is a 35 y.o. female who presents to the Emergency Department complaining of moderate mid lower abdominal pain x 2 days with associated dysuria, and urgency. She denies nausea, vomiting, fever, vaginal bleeding, and vaginal discharge. No alleviating factors noted.   History reviewed. No pertinent past medical history. Past Surgical History  Procedure Laterality Date  . Hernia repair    . Cesarean section     No family history on file. Social History  Substance Use Topics  . Smoking status: Current Some Day Smoker  . Smokeless tobacco: None  . Alcohol Use: Yes   OB History    No data available     Review of Systems  Constitutional: Negative for fever and chills.  Gastrointestinal: Positive for abdominal pain. Negative for nausea and vomiting.  Genitourinary: Positive for dysuria and urgency. Negative for vaginal bleeding and vaginal discharge.  All other systems reviewed and are negative.   Allergies  Review of patient's allergies indicates no known allergies.  Home Medications   Prior to Admission medications   Medication Sig Start Date End Date Taking? Authorizing Provider  acetaminophen (TYLENOL) 500 MG tablet Take 1 tablet (500 mg total) by mouth every 6 (six) hours as needed. Patient not taking: Reported on 12/23/2015 11/28/15   Antony MaduraKelly Humes, PA-C  cephALEXin (KEFLEX) 500 MG capsule Take 2 capsules (1,000 mg total) by mouth 2 (two) times daily. 03/17/16   Morgan Creasehristopher J Kairo Laubacher, MD  norelgestromin-ethinyl estradiol  (ORTHO EVRA) 150-35 MCG/24HR transdermal patch Place 1 patch onto the skin once a week. 12/23/15   Peggy Constant, MD  ondansetron (ZOFRAN) 4 MG tablet Take 1 tablet (4 mg total) by mouth every 8 (eight) hours as needed for nausea or vomiting. Patient not taking: Reported on 12/23/2015 11/17/15   Charm RingsErin J Honig, MD  phenazopyridine (PYRIDIUM) 200 MG tablet Take 1 tablet (200 mg total) by mouth 3 (three) times daily as needed for pain. 03/17/16   Morgan Creasehristopher J Delmy Holdren, MD  promethazine (PHENERGAN) 25 MG tablet Take 1 tablet (25 mg total) by mouth every 6 (six) hours as needed for nausea or vomiting. Patient not taking: Reported on 12/23/2015 08/26/15   Linna HoffJames D Kindl, MD  traMADol (ULTRAM) 50 MG tablet Take 1 tablet (50 mg total) by mouth every 6 (six) hours as needed. 03/17/16   Morgan Creasehristopher J Alixandrea Milleson, MD   BP 127/88 mmHg  Pulse 91  Temp(Src) 98.6 F (37 C) (Oral)  Resp 18  SpO2 100%  LMP 02/19/2016 Physical Exam  Constitutional: She is oriented to person, place, and time. She appears well-developed and well-nourished. No distress.  HENT:  Head: Normocephalic and atraumatic.  Right Ear: Hearing normal.  Left Ear: Hearing normal.  Nose: Nose normal.  Mouth/Throat: Oropharynx is clear and moist and mucous membranes are normal.  Eyes: Conjunctivae and EOM are normal. Pupils are equal, round, and reactive to light.  Neck: Normal range of motion. Neck supple.  Cardiovascular: Regular rhythm, S1 normal and S2 normal.  Exam reveals no gallop and no  friction rub.   No murmur heard. Pulmonary/Chest: Effort normal and breath sounds normal. No respiratory distress. She exhibits no tenderness.  Abdominal: Soft. Normal appearance and bowel sounds are normal. There is no hepatosplenomegaly. There is tenderness (suprapubic). There is no rebound, no guarding, no tenderness at McBurney's point and negative Murphy's sign. No hernia.  Musculoskeletal: Normal range of motion.  Neurological: She is alert and oriented to  person, place, and time. She has normal strength. No cranial nerve deficit or sensory deficit. Coordination normal. GCS eye subscore is 4. GCS verbal subscore is 5. GCS motor subscore is 6.  Skin: Skin is warm, dry and intact. No rash noted. No cyanosis.  Psychiatric: She has a normal mood and affect. Her speech is normal and behavior is normal. Thought content normal.  Nursing note and vitals reviewed.   ED Course  Procedures   DIAGNOSTIC STUDIES:  Oxygen Saturation is 100% on RA, normal by my interpretation.    COORDINATION OF CARE:  1:01 AM Discussed treatment plan with pt at bedside and pt agreed to plan.  Labs Review Labs Reviewed  COMPREHENSIVE METABOLIC PANEL - Abnormal; Notable for the following:    Glucose, Bld 103 (*)    Albumin 3.4 (*)    All other components within normal limits  CBC - Abnormal; Notable for the following:    Hemoglobin 10.2 (*)    HCT 32.3 (*)    MCV 73.4 (*)    MCH 23.2 (*)    All other components within normal limits  URINALYSIS, ROUTINE W REFLEX MICROSCOPIC (NOT AT [facility]) - Abnormal; Notable for the following:    APPearance CLOUDY (*)    Specific Gravity, Urine 1.031 (*)    Ketones, ur 15 (*)    Leukocytes, UA MODERATE (*)    All other components within normal limits  URINE MICROSCOPIC-ADD ON - Abnormal; Notable for the following:    Squamous Epithelial / LPF 0-5 (*)    Bacteria, UA MANY (*)    All other components within normal limits  LIPASE, BLOOD  POC URINE PREG, ED    Imaging Review No results found. I have personally reviewed and evaluated these images and lab results as part of my medical decision-making.   EKG Interpretation None      MDM   Final diagnoses:  UTI (lower urinary tract infection)    Patient presents to the emergency department for evaluation of suprapubic discomfort. Symptoms began yesterday. She reports continuous discomfort that worsens when she urinates. She has not had any fever, nausea, vomiting,  diarrhea. Patient has not noticed any vaginal discharge or vaginal bleeding. Examination did reveal suprapubic tenderness without guarding or rebound. No right lower quadrant tenderness, the left lower quadrant tenderness. No upper abdominal symptoms. Lab work was normal other than evidence of urinary tract infection on urinalysis. Patient will be treated with Keflex, Pyridium, Ultram. She was counseled that she needs to return to the ER immediately if she has worsening pain to rule out other sources of the abdominal pain, as we have not performed any imaging such as CT today. I did discuss with her and did not feel that she required imaging at this point, can return if her symptoms worsen.  I personally performed the services described in this documentation, which was scribed in my presence. The recorded information has been reviewed and is accurate.    Morgan Crease, MD 03/17/16 [id number]

## 2016-03-17 NOTE — Discharge Instructions (Signed)

## 2017-05-20 ENCOUNTER — Ambulatory Visit (HOSPITAL_COMMUNITY)
Admission: EM | Admit: 2017-05-20 | Discharge: 2017-05-20 | Disposition: A | Discharge status: Home / Self Care | Payer: Medicaid Other | Attending: Internal Medicine | Admitting: Internal Medicine

## 2017-05-20 ENCOUNTER — Encounter (HOSPITAL_COMMUNITY): Payer: Self-pay | Admitting: Emergency Medicine

## 2017-05-20 DIAGNOSIS — O219 Vomiting of pregnancy, unspecified: Secondary | ICD-10-CM

## 2017-05-20 LAB — POCT URINALYSIS DIP (DEVICE)
BILIRUBIN URINE: NEGATIVE
Glucose, UA: NEGATIVE mg/dL
Hgb urine dipstick: NEGATIVE
KETONES UR: NEGATIVE mg/dL
Nitrite: NEGATIVE
PH: 6 (ref 5.0–8.0)
Protein, ur: 30 mg/dL — AB
Urobilinogen, UA: 1 mg/dL (ref 0.0–1.0)

## 2017-05-20 LAB — POCT PREGNANCY, URINE: PREG TEST UR: POSITIVE — AB

## 2017-05-20 NOTE — Discharge Instructions (Signed)
Please take OTC prenatal vitamins, eat small frequent meals. Follow up with your OB/GYN regarding prenatal care. Rest,push fluids. Go to Er for new or worsening symptoms. Return to UC as needed. By your LMP dates you are ~ [redacted] weeks pregnant, [facility]

## 2017-05-20 NOTE — ED Triage Notes (Signed)
Pt reports nausea for a few weeks. PT had zofran, but ran out. No vomiting recently

## 2017-05-20 NOTE — ED Provider Notes (Signed)
CSN: [id number]     Arrival date & time 05/20/17  1848 History   None    Chief Complaint  Patient presents with  . Nausea   (Consider location/radiation/quality/duration/timing/severity/associated sxs/prior Treatment) 36 yr old AA female present to UC with cc of nausea, had some leftover Zofran at home, took it with relief, ran out. States LMP was end of April 2018. Denies dysuria. No other c/o at present.    The history is provided by the patient. No language interpreter was used.    History reviewed. No pertinent past medical history. Past Surgical History:  Procedure Laterality Date  . CESAREAN SECTION    . HERNIA REPAIR     No family history on file. Social History  Substance Use Topics  . Smoking status: Current Some Day Smoker  . Smokeless tobacco: Never Used  . Alcohol use Yes     Comment: occ.   OB History    No data available     Review of Systems  Constitutional: Positive for appetite change. Negative for chills and fever.  HENT: Negative.   Respiratory: Negative for shortness of breath.   Cardiovascular: Negative for chest pain.  Gastrointestinal: Positive for nausea. Negative for abdominal pain, constipation, diarrhea and vomiting.  Endocrine: Negative.   Genitourinary: Negative for dysuria, hematuria, vaginal bleeding and vaginal discharge.  Skin: Negative for rash.  Allergic/Immunologic: Negative.   Neurological: Negative.   Hematological: Negative.   Psychiatric/Behavioral: Negative.   All other systems reviewed and are negative.   Allergies  Patient has no known allergies.  Home Medications   Prior to Admission medications   Medication Sig Start Date End Date Taking? Authorizing Provider  acetaminophen (TYLENOL) 500 MG tablet Take 1 tablet (500 mg total) by mouth every 6 (six) hours as needed. Patient not taking: Reported on 12/23/2015 11/28/15   Antony MaduraHumes, Kelly, PA-C  norelgestromin-ethinyl estradiol (ORTHO EVRA) 150-35 MCG/24HR transdermal patch  Place 1 patch onto the skin once a week. 12/23/15   Constant, Peggy, MD  ondansetron (ZOFRAN) 4 MG tablet Take 1 tablet (4 mg total) by mouth every 8 (eight) hours as needed for nausea or vomiting. Patient not taking: Reported on 12/23/2015 11/17/15   Charm RingsHonig, Erin J, MD  phenazopyridine (PYRIDIUM) 200 MG tablet Take 1 tablet (200 mg total) by mouth 3 (three) times daily as needed for pain. 03/17/16   Gilda CreasePollina, Christopher J, MD  promethazine (PHENERGAN) 25 MG tablet Take 1 tablet (25 mg total) by mouth every 6 (six) hours as needed for nausea or vomiting. Patient not taking: Reported on 12/23/2015 08/26/15   Linna HoffKindl, James D, MD  traMADol (ULTRAM) 50 MG tablet Take 1 tablet (50 mg total) by mouth every 6 (six) hours as needed. 03/17/16   Gilda CreasePollina, Christopher J, MD   Meds Ordered and Administered this Visit  Medications - No data to display  BP 135/86   Pulse 85   Temp 98.7 F (37.1 C) (Oral)   Resp 16   Ht 5\' 2"  (1.575 m)   Wt 160 lb (72.6 kg)   LMP 03/05/2017   SpO2 100%   BMI 29.26 kg/m  No data found.   Physical Exam  Constitutional: She appears well-developed and well-nourished. She is active and cooperative.  HENT:  Head: Normocephalic.  Right Ear: Tympanic membrane normal.  Left Ear: Tympanic membrane normal.  Nose: Nose normal.  Mouth/Throat: Uvula is midline, oropharynx is clear and moist and mucous membranes are normal.  Neck: Trachea normal.  Cardiovascular: Normal rate, regular  rhythm, normal heart sounds and normal pulses.   Pulmonary/Chest: Effort normal and breath sounds normal.  Abdominal: Normal appearance and bowel sounds are normal. There is no tenderness.  Neurological: She is alert. No cranial nerve deficit or sensory deficit. GCS eye subscore is 4. GCS verbal subscore is 5. GCS motor subscore is 6.  Psychiatric: She has a normal mood and affect. Her speech is normal and behavior is normal.  Nursing note and vitals reviewed.   Urgent Care Course     Procedures  (including critical care time)  Labs Review Labs Reviewed  POCT URINALYSIS DIP (DEVICE) - Abnormal; Notable for the following:       Result Value   Protein, ur 30 (*)    Leukocytes, UA TRACE (*)    All other components within normal limits  POCT PREGNANCY, URINE - Abnormal; Notable for the following:    Preg Test, Ur POSITIVE (*)    All other components within normal limits    Imaging Review No results found.       MDM   1. Nausea and vomiting in pregnancy prior to [redacted] weeks gestation     Discussed + preg test result with pt, ~ 9wk per LMP. Advised pt against Zofran d/t possibility of birth defects. Rest,push fluids. Advised to please take OTC prenatal vitamins, eat small frequent meals. Follow up with your OB/GYN regarding prenatal care. Rest,push fluids. Go to Er for new or worsening symptoms. Return to UC as needed. By your LMP dates you are ~ [redacted] weeks pregnant, [facility] 12/22/2017. Pt verbalized understanding to this provider.    Clancy Gourd, NP 05/21/17 1201

## 2017-06-18 LAB — OB RESULTS CONSOLE ABO/RH: RH TYPE: POSITIVE

## 2017-06-18 LAB — OB RESULTS CONSOLE GC/CHLAMYDIA
CHLAMYDIA, DNA PROBE: NEGATIVE
Gonorrhea: NEGATIVE

## 2017-06-18 LAB — OB RESULTS CONSOLE ANTIBODY SCREEN: Antibody Screen: NEGATIVE

## 2017-06-18 LAB — OB RESULTS CONSOLE HEPATITIS B SURFACE ANTIGEN: HEP B S AG: NEGATIVE

## 2017-06-18 LAB — OB RESULTS CONSOLE RUBELLA ANTIBODY, IGM: RUBELLA: IMMUNE

## 2017-06-18 LAB — OB RESULTS CONSOLE RPR: RPR: NONREACTIVE

## 2017-06-18 LAB — OB RESULTS CONSOLE HIV ANTIBODY (ROUTINE TESTING): HIV: NONREACTIVE

## 2017-06-22 ENCOUNTER — Other Ambulatory Visit (HOSPITAL_COMMUNITY): Payer: Self-pay | Admitting: Nurse Practitioner

## 2017-06-22 DIAGNOSIS — Z363 Encounter for antenatal screening for malformations: Secondary | ICD-10-CM

## 2017-07-04 ENCOUNTER — Other Ambulatory Visit: Payer: Self-pay | Admitting: Nurse Practitioner

## 2017-07-04 DIAGNOSIS — N63 Unspecified lump in unspecified breast: Secondary | ICD-10-CM

## 2017-07-10 ENCOUNTER — Encounter (HOSPITAL_COMMUNITY): Payer: Self-pay | Admitting: *Deleted

## 2017-07-11 ENCOUNTER — Other Ambulatory Visit (HOSPITAL_COMMUNITY): Payer: Self-pay | Admitting: Nurse Practitioner

## 2017-07-11 ENCOUNTER — Ambulatory Visit (HOSPITAL_COMMUNITY)
Admission: RE | Admit: 2017-07-11 | Discharge: 2017-07-11 | Disposition: A | Discharge status: Home / Self Care | Payer: Medicaid Other | Source: Ambulatory Visit | Attending: Nurse Practitioner | Admitting: Nurse Practitioner

## 2017-07-11 ENCOUNTER — Ambulatory Visit (HOSPITAL_COMMUNITY)
Admission: RE | Admit: 2017-07-11 | Discharge: 2017-07-11 | Disposition: A | Discharge status: Home / Self Care | Payer: Medicaid Other | Source: Ambulatory Visit | Attending: Obstetrics and Gynecology | Admitting: Obstetrics and Gynecology

## 2017-07-11 ENCOUNTER — Other Ambulatory Visit: Payer: Self-pay

## 2017-07-11 ENCOUNTER — Encounter (HOSPITAL_COMMUNITY): Payer: Self-pay

## 2017-07-11 DIAGNOSIS — O3482 Maternal care for other abnormalities of pelvic organs, second trimester: Secondary | ICD-10-CM | POA: Diagnosis not present

## 2017-07-11 DIAGNOSIS — Z3687 Encounter for antenatal screening for uncertain dates: Secondary | ICD-10-CM | POA: Insufficient documentation

## 2017-07-11 DIAGNOSIS — Z3A16 16 weeks gestation of pregnancy: Secondary | ICD-10-CM | POA: Insufficient documentation

## 2017-07-11 DIAGNOSIS — O09522 Supervision of elderly multigravida, second trimester: Secondary | ICD-10-CM | POA: Insufficient documentation

## 2017-07-11 DIAGNOSIS — Z3689 Encounter for other specified antenatal screening: Secondary | ICD-10-CM

## 2017-07-11 DIAGNOSIS — Z363 Encounter for antenatal screening for malformations: Secondary | ICD-10-CM | POA: Diagnosis present

## 2017-07-11 DIAGNOSIS — Z3492 Encounter for supervision of normal pregnancy, unspecified, second trimester: Secondary | ICD-10-CM

## 2017-07-11 DIAGNOSIS — O281 Abnormal biochemical finding on antenatal screening of mother: Secondary | ICD-10-CM | POA: Insufficient documentation

## 2017-07-11 DIAGNOSIS — N83292 Other ovarian cyst, left side: Secondary | ICD-10-CM | POA: Diagnosis not present

## 2017-07-11 DIAGNOSIS — O34219 Maternal care for unspecified type scar from previous cesarean delivery: Secondary | ICD-10-CM | POA: Insufficient documentation

## 2017-07-11 NOTE — ED Notes (Signed)
Pt reports leaking a very small amt of clear watery fluid 4 days ago.  No leaking since.

## 2017-07-11 NOTE — Progress Notes (Signed)
Appointment Date: 07/11/2017 DOB: 12-17-1980 Referring Provider: Catalina Antigua, MD Attending: Dr. Berna Spare  [name]  seen for genetic counseling because of a maternal age of 36 y.o.  She was accompanied to the visit by her cousin.     In summary:  Discussed AMA and associated risk for fetal aneuploidy  Discussed options for screening  Quad screen  Previously drawn with increased Down syndrome risk  New dating indicated drawn too early  Declined redraw today  NIPS - declined today will consider at next ultrasound  Ultrasound  Discussed diagnostic testing options  Amniocentesis - declined  Reviewed family history concerns - none reported  Discussed carrier screening options - declined  CF  SMA  Hemoglobinopathies  She was counseled regarding maternal age and the association with risk for chromosome conditions due to nondisjunction with aging of the ova.   We reviewed chromosomes, nondisjunction, and the associated 1 in 43 risk for fetal aneuploidy related to a maternal age of 36 y.o. at [redacted] weeks gestation.  She was counseled that the risk for aneuploidy decreases as gestational age increases, accounting for those pregnancies which spontaneously abort.  We specifically discussed Down syndrome (trisomy 85), trisomies 34 and 85, and sex chromosome aneuploidies (47,XXX and 47,XXY) including the common features and prognoses of each.  We reviewed that she previously had a maternal serum Quad screen which suggested an increased risk for Down syndrome.  However, her due date was recalculated today, making that first sample drawn too early.    We reviewed available screening options including repeating her Quad screen, noninvasive prenatal screening (NIPS)/cell free DNA (cfDNA) screening, and detailed ultrasound.  She was counseled that screening tests are used to modify a patient's a priori risk for aneuploidy, typically based on age. This estimate provides a pregnancy  specific risk assessment. We reviewed the benefits and limitations of each option. Specifically, we discussed the conditions for which each test screens, the detection rates, and false positive rates of each. She was also counseled regarding diagnostic testing via amniocentesis. We reviewed the approximate 1 in 300-500 risk for complications from amniocentesis, including spontaneous pregnancy loss. We discussed the possible results that the tests might provide including: positive, negative, unanticipated, and no result. Finally, she was counseled regarding the cost of each option and potential out of pocket expenses.  A complete ultrasound was performed today. The ultrasound report will be sent under separate cover. There were no visualized fetal anomalies or markers suggestive of aneuploidy, however she was only 16 weeks and her anatomy was not all well visualized.  She will return in 2 weeks for complete anatomy. Diagnostic testing was declined today and additional screening was declined today.   [name] stated that she would make a decision about screening tests after her next ultrasound. She understands that screening tests cannot rule out all birth defects or genetic syndromes. The patient was advised of this limitation and states she still does not want additional testing at this time.   [name] was provided with written information regarding cystic fibrosis (CF), spinal muscular atrophy (SMA) and hemoglobinopathies including the carrier frequency, availability of carrier screening and prenatal diagnosis if indicated.  In addition, we discussed that CF and hemoglobinopathies are routinely screened for as part of the [place] newborn screening panel.  After further discussion, she declined screening for CF, SMA and hemoglobinopathies.  Both family histories were reviewed and found to be noncontributory for birth defects, intellectual disability, and known genetic conditions. Without further  information  regarding the provided family history, an accurate genetic risk cannot be calculated. Further genetic counseling is warranted if more information is obtained.  [name] denied exposure to environmental toxins or chemical agents. She denied the use of alcohol, tobacco or street drugs. She denied significant viral illnesses during the course of her pregnancy. Her medical and surgical histories were noncontributory.   I counseled [name] regarding the above risks and available options.  The approximate face-to-face time with the genetic counselor was 40 minutes.  Morgan Gemma, MS,  Certified Genetic Counselor

## 2017-07-12 ENCOUNTER — Other Ambulatory Visit (HOSPITAL_COMMUNITY): Payer: Self-pay | Admitting: *Deleted

## 2017-07-12 DIAGNOSIS — O09529 Supervision of elderly multigravida, unspecified trimester: Secondary | ICD-10-CM

## 2017-07-18 ENCOUNTER — Other Ambulatory Visit: Payer: Self-pay | Admitting: Nurse Practitioner

## 2017-07-18 ENCOUNTER — Ambulatory Visit
Admission: RE | Admit: 2017-07-18 | Discharge: 2017-07-18 | Disposition: A | Discharge status: Home / Self Care | Payer: Medicaid Other | Source: Ambulatory Visit | Attending: Nurse Practitioner | Admitting: Nurse Practitioner

## 2017-07-18 ENCOUNTER — Other Ambulatory Visit: Payer: Medicaid Other

## 2017-07-18 DIAGNOSIS — N6320 Unspecified lump in the left breast, unspecified quadrant: Secondary | ICD-10-CM

## 2017-07-18 DIAGNOSIS — N63 Unspecified lump in unspecified breast: Secondary | ICD-10-CM

## 2017-07-25 ENCOUNTER — Ambulatory Visit (HOSPITAL_COMMUNITY)
Admission: RE | Admit: 2017-07-25 | Discharge: 2017-07-25 | Disposition: A | Discharge status: Home / Self Care | Payer: Medicaid Other | Source: Ambulatory Visit | Attending: Nurse Practitioner | Admitting: Nurse Practitioner

## 2017-08-06 ENCOUNTER — Other Ambulatory Visit (HOSPITAL_COMMUNITY): Payer: Self-pay | Admitting: Maternal and Fetal Medicine

## 2017-08-06 ENCOUNTER — Ambulatory Visit (HOSPITAL_COMMUNITY)
Admission: RE | Admit: 2017-08-06 | Discharge: 2017-08-06 | Disposition: A | Discharge status: Home / Self Care | Payer: Medicaid Other | Source: Ambulatory Visit | Attending: Maternal and Fetal Medicine | Admitting: Maternal and Fetal Medicine

## 2017-08-06 ENCOUNTER — Other Ambulatory Visit (HOSPITAL_COMMUNITY): Payer: Self-pay | Admitting: *Deleted

## 2017-08-06 DIAGNOSIS — O34219 Maternal care for unspecified type scar from previous cesarean delivery: Secondary | ICD-10-CM

## 2017-08-06 DIAGNOSIS — O281 Abnormal biochemical finding on antenatal screening of mother: Secondary | ICD-10-CM

## 2017-08-06 DIAGNOSIS — IMO0002 Reserved for concepts with insufficient information to code with codable children: Secondary | ICD-10-CM

## 2017-08-06 DIAGNOSIS — Z362 Encounter for other antenatal screening follow-up: Secondary | ICD-10-CM | POA: Insufficient documentation

## 2017-08-06 DIAGNOSIS — O289 Unspecified abnormal findings on antenatal screening of mother: Secondary | ICD-10-CM | POA: Diagnosis present

## 2017-08-06 DIAGNOSIS — O09522 Supervision of elderly multigravida, second trimester: Secondary | ICD-10-CM | POA: Diagnosis not present

## 2017-08-06 DIAGNOSIS — Z3A20 20 weeks gestation of pregnancy: Secondary | ICD-10-CM | POA: Diagnosis not present

## 2017-08-06 DIAGNOSIS — O09529 Supervision of elderly multigravida, unspecified trimester: Secondary | ICD-10-CM

## 2017-08-06 DIAGNOSIS — Z0489 Encounter for examination and observation for other specified reasons: Secondary | ICD-10-CM

## 2017-10-01 ENCOUNTER — Encounter (HOSPITAL_COMMUNITY): Payer: Self-pay

## 2017-10-01 ENCOUNTER — Other Ambulatory Visit (HOSPITAL_COMMUNITY): Payer: Self-pay | Admitting: Maternal and Fetal Medicine

## 2017-10-01 ENCOUNTER — Ambulatory Visit (HOSPITAL_COMMUNITY)
Admission: RE | Admit: 2017-10-01 | Discharge: 2017-10-01 | Disposition: A | Discharge status: Home / Self Care | Payer: Medicaid Other | Source: Ambulatory Visit | Attending: Nurse Practitioner | Admitting: Nurse Practitioner

## 2017-10-01 DIAGNOSIS — O09523 Supervision of elderly multigravida, third trimester: Secondary | ICD-10-CM | POA: Insufficient documentation

## 2017-10-01 DIAGNOSIS — O280 Abnormal hematological finding on antenatal screening of mother: Secondary | ICD-10-CM

## 2017-10-01 DIAGNOSIS — IMO0002 Reserved for concepts with insufficient information to code with codable children: Secondary | ICD-10-CM

## 2017-10-01 DIAGNOSIS — Z0489 Encounter for examination and observation for other specified reasons: Secondary | ICD-10-CM

## 2017-10-01 DIAGNOSIS — O34219 Maternal care for unspecified type scar from previous cesarean delivery: Secondary | ICD-10-CM

## 2017-10-01 DIAGNOSIS — Z3A28 28 weeks gestation of pregnancy: Secondary | ICD-10-CM | POA: Diagnosis not present

## 2017-10-02 ENCOUNTER — Encounter (HOSPITAL_COMMUNITY): Payer: Self-pay

## 2017-10-04 ENCOUNTER — Other Ambulatory Visit (HOSPITAL_COMMUNITY): Payer: Self-pay | Admitting: Nurse Practitioner

## 2017-10-04 DIAGNOSIS — Z3A34 34 weeks gestation of pregnancy: Secondary | ICD-10-CM

## 2017-10-04 DIAGNOSIS — O09523 Supervision of elderly multigravida, third trimester: Secondary | ICD-10-CM

## 2017-10-18 ENCOUNTER — Other Ambulatory Visit: Payer: Self-pay | Admitting: Nurse Practitioner

## 2017-10-18 ENCOUNTER — Other Ambulatory Visit: Payer: Medicaid Other

## 2017-10-18 ENCOUNTER — Ambulatory Visit
Admission: RE | Admit: 2017-10-18 | Discharge: 2017-10-18 | Disposition: A | Discharge status: Home / Self Care | Payer: Medicaid Other | Source: Ambulatory Visit | Attending: Nurse Practitioner | Admitting: Nurse Practitioner

## 2017-10-18 DIAGNOSIS — N6320 Unspecified lump in the left breast, unspecified quadrant: Secondary | ICD-10-CM

## 2017-10-20 IMAGING — US US OB TRANSVAGINAL
1 series · 14 of 28 positions shown · non-contrast
Comparison: None.

CLINICAL DATA: 35-year-old fragment female with vaginal bleeding

EXAM:
OBSTETRIC <14 WK US AND TRANSVAGINAL OB US
TECHNIQUE: Both transabdominal and transvaginal ultrasound examinations were
performed for complete evaluation of the gestation as well as the
maternal uterus, adnexal regions, and pelvic cul-de-sac.
Transvaginal technique was performed to assess early pregnancy.

[Series 1: us ob transvaginal · 0.17mm/px · 38 acquisitions, 14 frames shown]
[im 2/38]
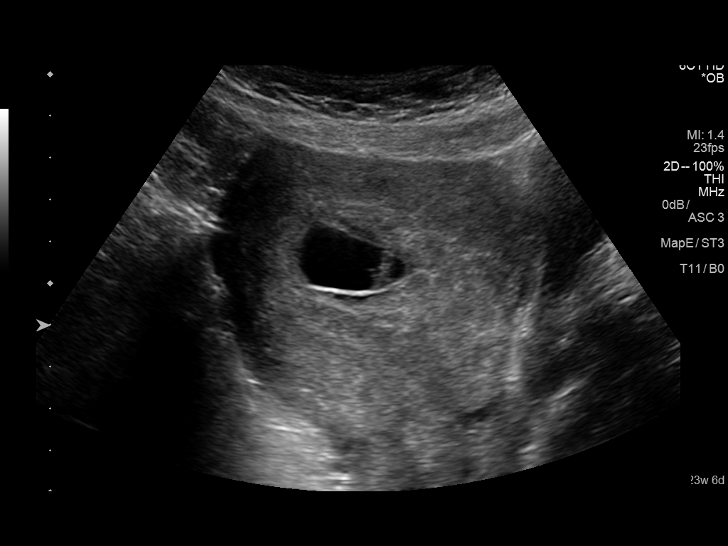
[im 5/38]
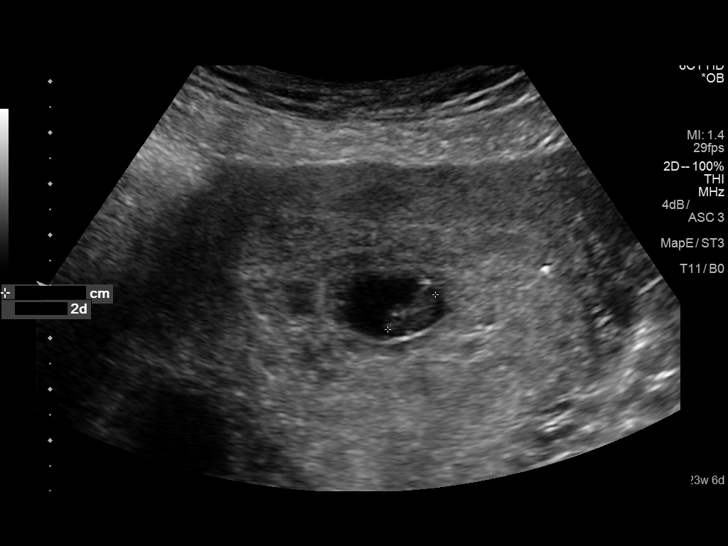
[im 7/38]
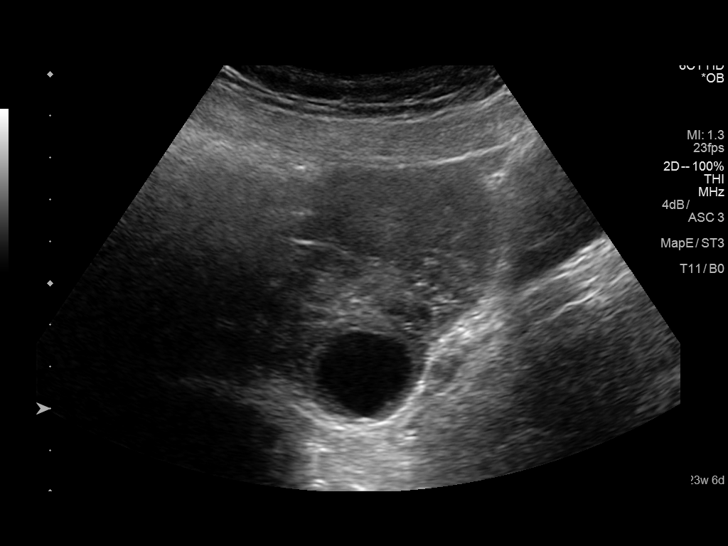
[im 10/38]
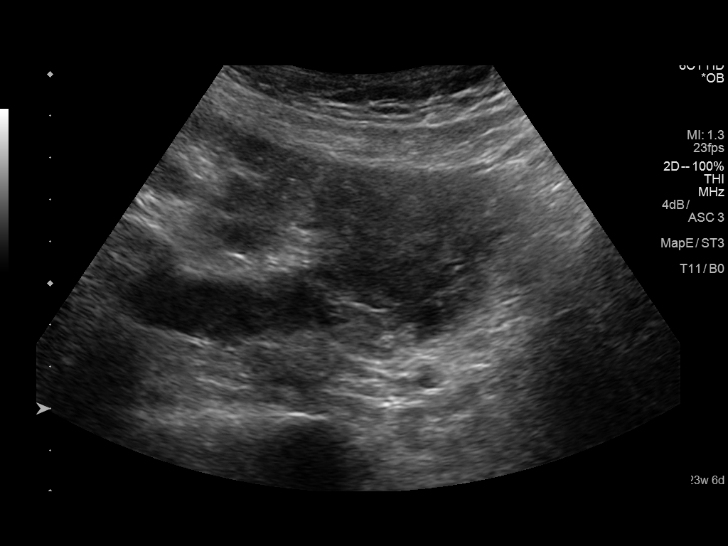
[im 13/38]
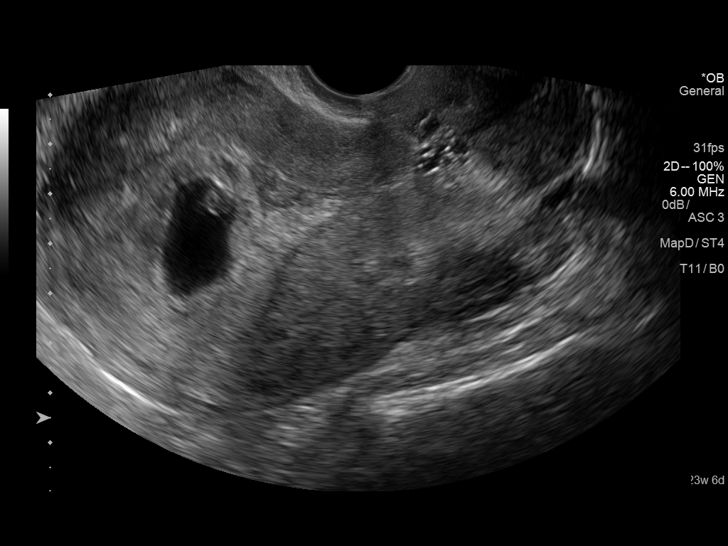
[im 16/38]
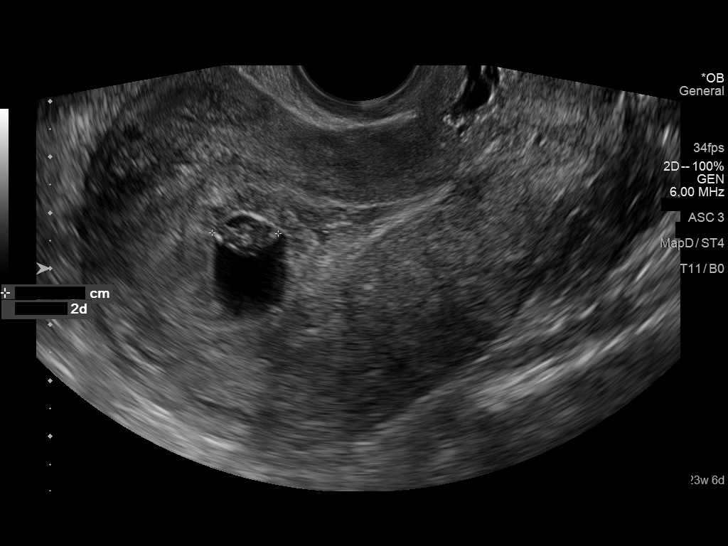
[im 18/38]
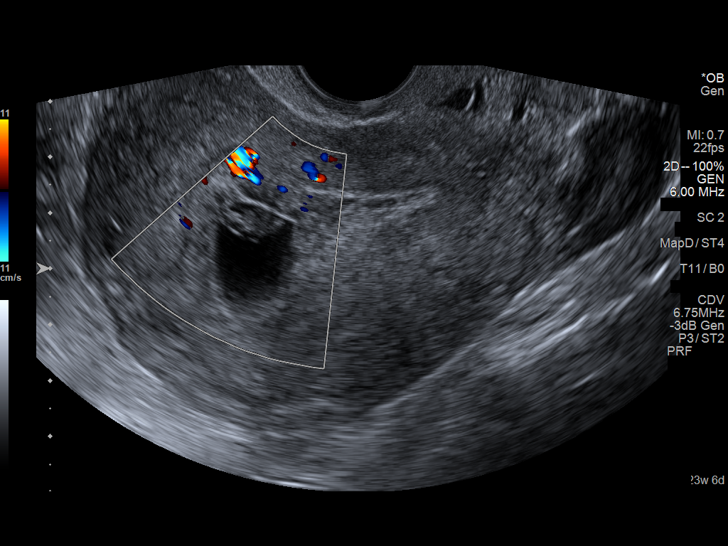
[im 21/38]
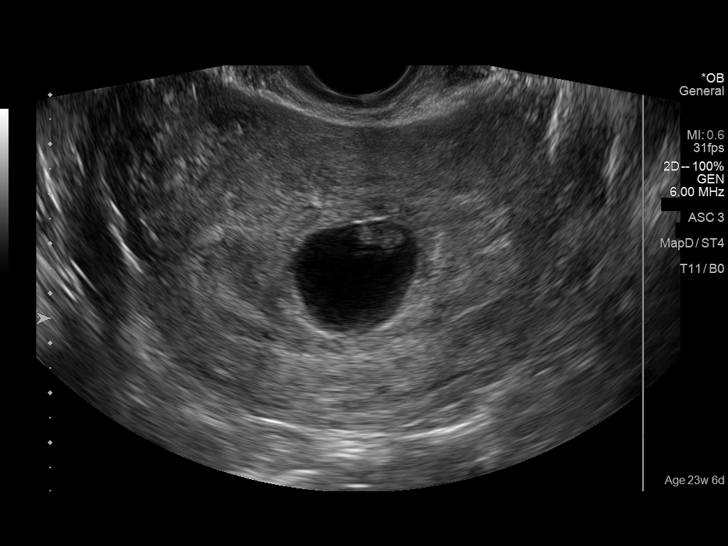
[im 24/38]
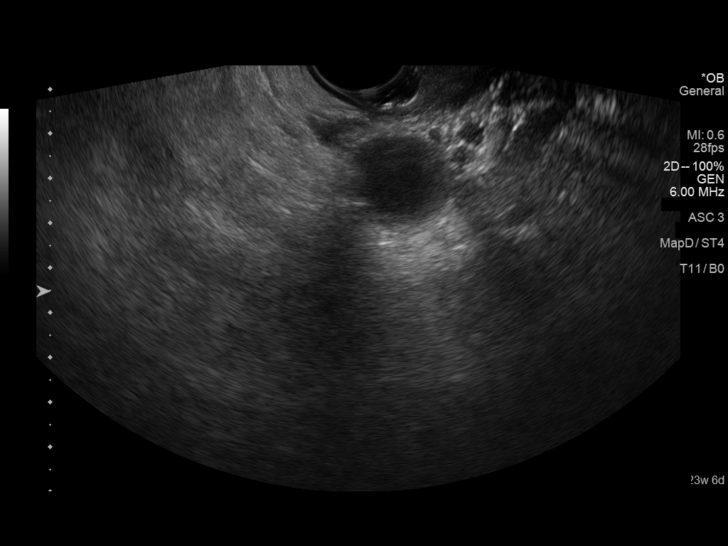
[im 27/38]
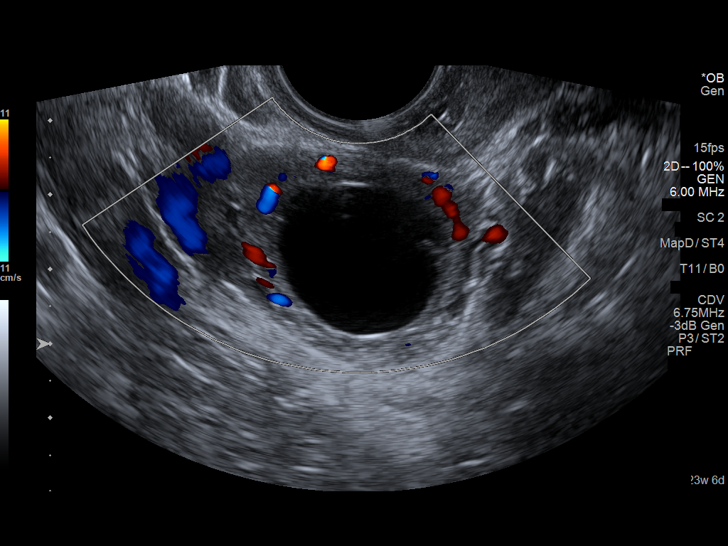
[im 29/38]
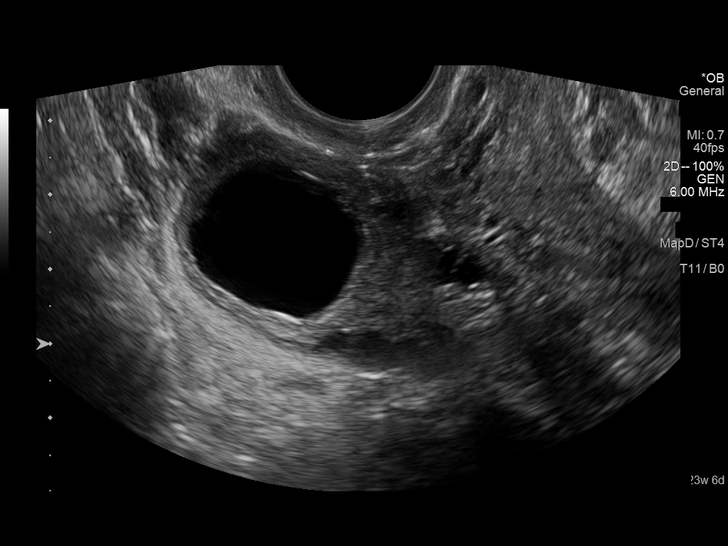
[im 32/38]
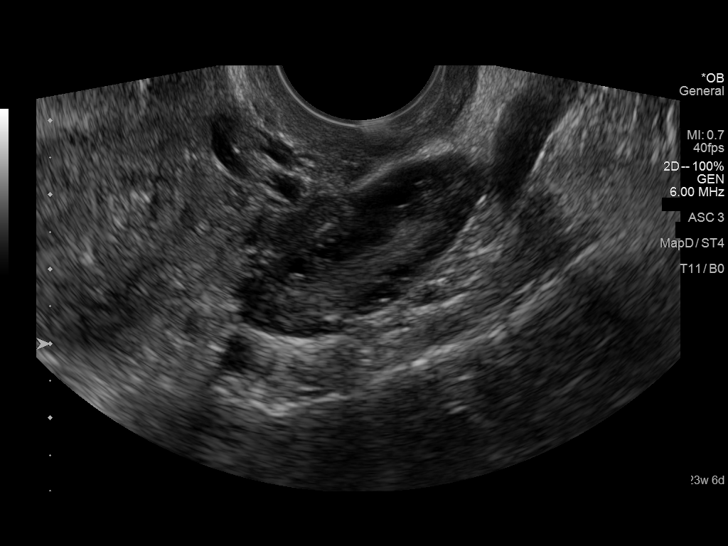
[im 35/38]
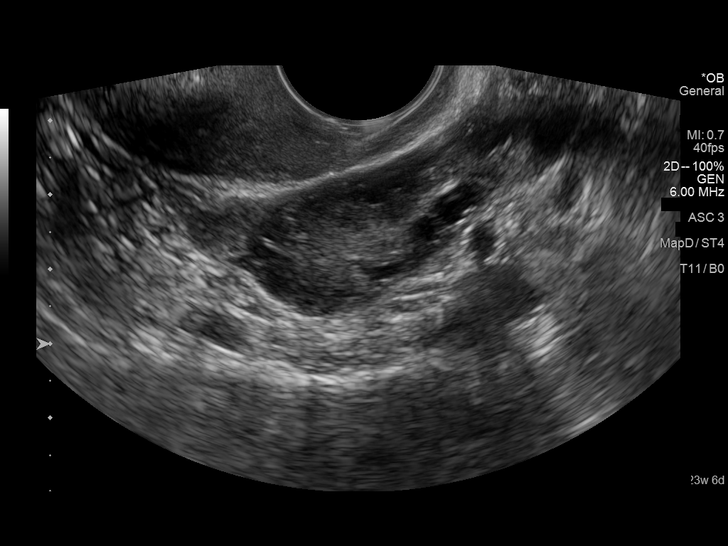
[im 38/38]
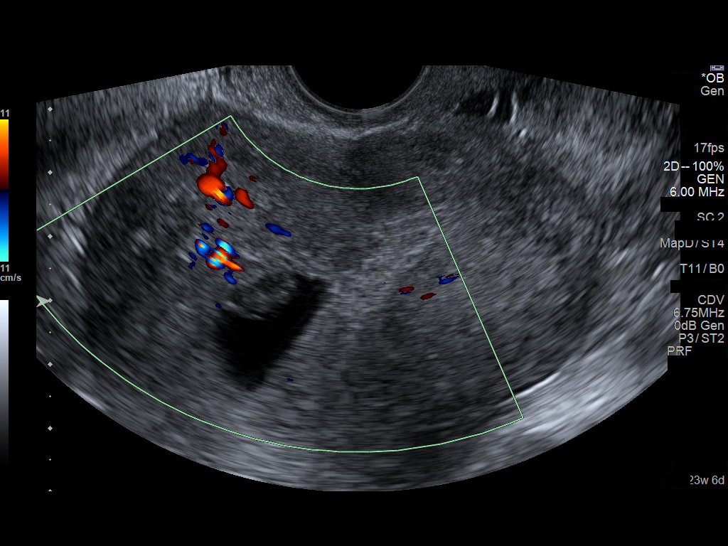

[14 of 28 positions shown; findings below may reference images not displayed]

FINDINGS: Intrauterine gestational sac: Single intrauterine gestational sac

Yolk sac:  Not seen

Embryo:  Present

Cardiac Activity: Not detected

Heart Rate: NA

CRL:  12  mm   7 w   3 d                  US EDC: 07/13/2016

Subchorionic hemorrhage:  Small

Maternal uterus/adnexae: There is a 2.4 x 2.0 x 2.4 cm right ovarian
cyst. The left ovary appears unremarkable. No significant free fluid
within the pelvis.
IMPRESSION: Single intrauterine pregnancy with an estimated gestational age of 7
weeks, 3 days. No fetal cardiac activity identified.

These results were called by telephone at the time of interpretation
on 11/28/2015 at [DATE] to Dr. Augustin, who verbally acknowledged
these results.

## 2017-10-22 NOTE — Progress Notes (Signed)
Cardiology Office Note   Date:  10/24/2017   ID:  Morgan Lee, DOB 10-22-1981, MRN [id number]  PCP:  Patient, No Pcp Per  Cardiologist:   Charlton HawsPeter Dudley Mages, MD   No chief complaint on file.     History of Present Illness: Morgan Lee is a 36 y.o. female who presents for evaluation of syncope , palpitations and dyspnea.  She is pregnant in her 2nd trimester. LMP April 2018 Still smoking some Previous C section Has some Nausea and vomiting used left over zofran despite risk of birth defects  She has rapid feeling in her heart when she is stressed or walking her Son to the bus. Has 3 healthy kids ages 585-16 No previous issues with Pregnancy. She didn't really pass out on interview but felt light headed Denies any sudden onset of palpitations and most of the time only rapid With mental or physical stress Having a healthy baby boy this time   Past Medical History:  Diagnosis Date  . [redacted] weeks gestation of pregnancy   . Advanced maternal age in multigravida, second trimester   . Depression   . Fatigue   . 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 Medical history non-contributory   . Palpitation   . SOB (shortness of breath)   . Syncope     Past Surgical History:  Procedure Laterality Date  . CESAREAN SECTION    . HERNIA REPAIR       Current Outpatient Medications  Medication Sig Dispense Refill  . ondansetron (ZOFRAN) 4 MG tablet Take 1 tablet (4 mg total) by mouth every 8 (eight) hours as needed for nausea or vomiting. 20 tablet 0  . Prenatal Vit-Fe Fumarate-FA (PRENATAL VITAMIN PO) Take by mouth.     No current facility-administered medications for this visit.     Allergies:   Patient has no known allergies.    Social History:  The patient  reports that she has quit smoking. she has never used smokeless tobacco. She reports that she drinks alcohol. She reports that she uses drugs. Drug: Marijuana.   Family History:  The patient's family history includes Asthma in her  sister; Cancer - Lung in her mother; Diabetes in her sister; Heart disease in her sister; Hypertension in her brother, mother, and sister; Seizures in her sister; Thyroid disease in her sister.    ROS:  Please see the history of present illness.   Otherwise, review of systems are positive for none.   All other systems are reviewed and negative.    PHYSICAL EXAM: VS:  BP 112/74   Pulse 97   Ht 5\' 3"  (1.6 m)   Wt 168 lb (76.2 kg)   LMP 03/04/2017 (Approximate)   SpO2 99%   BMI 29.76 kg/m  , BMI Body mass index is 29.76 kg/m. Affect appropriate Healthy:  appears stated age HEENT: normal Neck supple with no adenopathy JVP normal no bruits no thyromegaly Lungs clear with no wheezing and good diaphragmatic motion Heart:  S1/S2 1/6SEM  murmur, no rub, gallop or click PMI normal Abdomen: benighn, BS positve, no tenderness, no AAA no bruit.  No HSM or HJR Distal pulses intact with no bruits No edema Neuro non-focal Skin warm and dry No muscular weakness    EKG:  SR normal ECG 10/24/2017    Recent Labs: No results found for requested labs within last 8760 hours.    Lipid Panel No results found for: CHOL, TRIG, HDL, CHOLHDL, VLDL, LDLCALC, LDLDIRECT  Wt Readings from Last 3 Encounters:  10/24/17 168 lb (76.2 kg)  10/01/17 167 lb (75.8 kg)  08/06/17 165 lb (74.8 kg)      Other studies Reviewed: Additional studies/ records that were reviewed today include: note OB/GYN labs ER notes .    ASSESSMENT AND PLAN:  1.  Palpitaitons benign normal ECG resting HR under 100 observe 2. Dyspnea releted to weight gain and pregnancy no need for echo  3. Pregnancy f/u OB plans [facility]    Current medicines are reviewed at length with the patient today.  The patient does not have concerns regarding medicines.  The following changes have been made:  no change  Labs/ tests ordered today include: None  No orders of the defined types were placed in this  encounter.    Disposition:   FU with cardiology PRN      Signed, Charlton HawsPeter Sherlock Nancarrow, MD  10/24/2017 9:52 AM    [facility] [address], [place], [place]  [address] Phone: [phone]; Fax: [phone]

## 2017-10-24 ENCOUNTER — Ambulatory Visit (INDEPENDENT_AMBULATORY_CARE_PROVIDER_SITE_OTHER): Payer: BLUE CROSS/BLUE SHIELD | Admitting: Cardiovascular Disease

## 2017-10-24 ENCOUNTER — Encounter (INDEPENDENT_AMBULATORY_CARE_PROVIDER_SITE_OTHER): Payer: Self-pay

## 2017-10-24 VITALS — BP 112/74 | HR 97 | Ht 63.0 in | Wt 168.0 lb

## 2017-10-24 DIAGNOSIS — R0600 Dyspnea, unspecified: Secondary | ICD-10-CM

## 2017-10-24 DIAGNOSIS — Z3A32 32 weeks gestation of pregnancy: Secondary | ICD-10-CM

## 2017-10-24 DIAGNOSIS — R002 Palpitations: Secondary | ICD-10-CM | POA: Diagnosis not present

## 2017-10-24 NOTE — Patient Instructions (Signed)
Medication Instructions:  Your physician recommends that you continue on your current medications as directed. Please refer to the Current Medication list given to you today.  Labwork: NONE  Testing/Procedures: NONE  Follow-Up: Your physician wants you to follow-up in: as needed with Dr. Nishan.   If you need a refill on your cardiac medications before your next appointment, please call your pharmacy.    

## 2017-11-08 ENCOUNTER — Ambulatory Visit (HOSPITAL_COMMUNITY)
Admission: RE | Admit: 2017-11-08 | Discharge: 2017-11-08 | Disposition: A | Discharge status: Home / Self Care | Payer: Medicaid Other | Source: Ambulatory Visit | Attending: Nurse Practitioner | Admitting: Nurse Practitioner

## 2017-11-08 ENCOUNTER — Other Ambulatory Visit (HOSPITAL_COMMUNITY): Payer: Self-pay | Admitting: Nurse Practitioner

## 2017-11-08 DIAGNOSIS — O289 Unspecified abnormal findings on antenatal screening of mother: Secondary | ICD-10-CM

## 2017-11-08 DIAGNOSIS — Z3A34 34 weeks gestation of pregnancy: Secondary | ICD-10-CM

## 2017-11-08 DIAGNOSIS — O34219 Maternal care for unspecified type scar from previous cesarean delivery: Secondary | ICD-10-CM

## 2017-11-08 DIAGNOSIS — O09523 Supervision of elderly multigravida, third trimester: Secondary | ICD-10-CM

## 2017-11-16 LAB — OB RESULTS CONSOLE GC/CHLAMYDIA
Chlamydia: NEGATIVE
Gonorrhea: NEGATIVE

## 2017-11-16 LAB — OB RESULTS CONSOLE GBS: GBS: NEGATIVE

## 2017-11-20 NOTE — L&D Delivery Note (Signed)
Delivery Note After a 30 minute 2nd stage, at 4:04 AM a viable female was delivered via VBAC, Spontaneous (Presentation:LOA  ).  APGAR: ,8/9 ; weight pending.   After 1 minute, the cord was clamped and cut. 40 units of pitocin diluted in 1000cc LR was infused rapidly IV.  The placenta separated spontaneously and delivered via CCT and maternal pushing effort.  It was inspected and appears to be intact with a 3 VC.   Anesthesia:  none Episiotomy:  none Lacerations:  none Suture Repair:  Est. Blood Loss (mL):  300  Mom to postpartum.  Baby to Couplet care / Skin to Skin.  The above was performed by Dr. Neomia GlassParker Leland under my direct supervision and guidance.    CRESENZO-DISHMAN,Sharika Mosquera 12/21/2017, 4:10 AM

## 2017-12-20 ENCOUNTER — Inpatient Hospital Stay (HOSPITAL_COMMUNITY)
Admission: AD | Admit: 2017-12-20 | Discharge: 2017-12-22 | DRG: 798 | Disposition: A | Discharge status: Home / Self Care | Payer: BLUE CROSS/BLUE SHIELD | Source: Ambulatory Visit | Attending: Obstetrics & Gynecology | Admitting: Obstetrics & Gynecology

## 2017-12-20 ENCOUNTER — Encounter (HOSPITAL_COMMUNITY): Payer: Self-pay

## 2017-12-20 ENCOUNTER — Other Ambulatory Visit: Payer: Self-pay

## 2017-12-20 DIAGNOSIS — D649 Anemia, unspecified: Secondary | ICD-10-CM | POA: Diagnosis present

## 2017-12-20 DIAGNOSIS — O134 Gestational [pregnancy-induced] hypertension without significant proteinuria, complicating childbirth: Principal | ICD-10-CM | POA: Diagnosis present

## 2017-12-20 DIAGNOSIS — O9902 Anemia complicating childbirth: Secondary | ICD-10-CM | POA: Diagnosis present

## 2017-12-20 DIAGNOSIS — O34219 Maternal care for unspecified type scar from previous cesarean delivery: Secondary | ICD-10-CM | POA: Diagnosis present

## 2017-12-20 DIAGNOSIS — E669 Obesity, unspecified: Secondary | ICD-10-CM | POA: Diagnosis present

## 2017-12-20 DIAGNOSIS — O99214 Obesity complicating childbirth: Secondary | ICD-10-CM | POA: Diagnosis present

## 2017-12-20 DIAGNOSIS — F329 Major depressive disorder, single episode, unspecified: Secondary | ICD-10-CM | POA: Diagnosis present

## 2017-12-20 DIAGNOSIS — O99344 Other mental disorders complicating childbirth: Secondary | ICD-10-CM | POA: Diagnosis present

## 2017-12-20 DIAGNOSIS — Z87891 Personal history of nicotine dependence: Secondary | ICD-10-CM

## 2017-12-20 DIAGNOSIS — Z3A40 40 weeks gestation of pregnancy: Secondary | ICD-10-CM | POA: Diagnosis not present

## 2017-12-20 DIAGNOSIS — Z302 Encounter for sterilization: Secondary | ICD-10-CM | POA: Diagnosis not present

## 2017-12-20 DIAGNOSIS — O139 Gestational [pregnancy-induced] hypertension without significant proteinuria, unspecified trimester: Secondary | ICD-10-CM

## 2017-12-20 LAB — CBC
HCT: 28.8 % — ABNORMAL LOW (ref 36.0–46.0)
Hemoglobin: 9.2 g/dL — ABNORMAL LOW (ref 12.0–15.0)
MCH: 22.8 pg — ABNORMAL LOW (ref 26.0–34.0)
MCHC: 31.9 g/dL (ref 30.0–36.0)
MCV: 71.5 fL — ABNORMAL LOW (ref 78.0–100.0)
PLATELETS: 266 10*3/uL (ref 150–400)
RBC: 4.03 MIL/uL (ref 3.87–5.11)
RDW: 15.2 % (ref 11.5–15.5)
WBC: 5.8 10*3/uL (ref 4.0–10.5)

## 2017-12-20 LAB — COMPREHENSIVE METABOLIC PANEL
ALK PHOS: 165 U/L — AB (ref 38–126)
ALT: 19 U/L (ref 14–54)
ANION GAP: 12 (ref 5–15)
AST: 29 U/L (ref 15–41)
Albumin: 2.9 g/dL — ABNORMAL LOW (ref 3.5–5.0)
BILIRUBIN TOTAL: 0.3 mg/dL (ref 0.3–1.2)
BUN: 7 mg/dL (ref 6–20)
CALCIUM: 8.6 mg/dL — AB (ref 8.9–10.3)
CO2: 21 mmol/L — ABNORMAL LOW (ref 22–32)
Chloride: 100 mmol/L — ABNORMAL LOW (ref 101–111)
Creatinine, Ser: 0.56 mg/dL (ref 0.44–1.00)
GFR calc non Af Amer: >60 mL/min (ref >60)
GLUCOSE: 72 mg/dL (ref 65–99)
Potassium: 3.9 mmol/L (ref 3.5–5.1)
Sodium: 133 mmol/L — ABNORMAL LOW (ref 135–145)
TOTAL PROTEIN: 7.1 g/dL (ref 6.5–8.1)

## 2017-12-20 LAB — ABO/RH: ABO/RH(D): O POS

## 2017-12-20 LAB — TYPE AND SCREEN
ABO/RH(D): O POS
ANTIBODY SCREEN: NEGATIVE

## 2017-12-20 LAB — PROTEIN / CREATININE RATIO, URINE
CREATININE, URINE: 216 mg/dL
Protein Creatinine Ratio: 0.06 mg/mg{Cre} (ref 0.00–0.15)
Total Protein, Urine: 14 mg/dL

## 2017-12-20 MED ORDER — ACETAMINOPHEN 325 MG PO TABS
650.0000 mg | ORAL_TABLET | ORAL | Status: DC | PRN
  Administered 2017-12-21: 650 mg via ORAL
  Filled 2017-12-20: qty 2

## 2017-12-20 MED ORDER — LACTATED RINGERS IV SOLN: 500.0000 mL | INTRAVENOUS | Status: DC | PRN

## 2017-12-20 MED ORDER — LIDOCAINE HCL (PF) 1 % IJ SOLN
30.0000 mL | INTRAMUSCULAR | Status: DC | PRN
  Filled 2017-12-20: qty 30

## 2017-12-20 MED ORDER — TERBUTALINE SULFATE 1 MG/ML IJ SOLN
0.2500 mg | Freq: Once | INTRAMUSCULAR | Status: DC | PRN
  Filled 2017-12-20: qty 1

## 2017-12-20 MED ORDER — LACTATED RINGERS IV SOLN
INTRAVENOUS | Status: DC
  Administered 2017-12-20: 18:00:00 via INTRAVENOUS

## 2017-12-20 MED ORDER — OXYCODONE-ACETAMINOPHEN 5-325 MG PO TABS: 1.0000 | ORAL_TABLET | ORAL | Status: DC | PRN

## 2017-12-20 MED ORDER — OXYTOCIN 40 UNITS IN LACTATED RINGERS INFUSION - SIMPLE MED
1.0000 m[IU]/min | INTRAVENOUS | Status: DC
  Administered 2017-12-20: 2 m[IU]/min via INTRAVENOUS
  Filled 2017-12-20: qty 1000

## 2017-12-20 MED ORDER — SOD CITRATE-CITRIC ACID 500-334 MG/5ML PO SOLN: 30.0000 mL | ORAL | Status: DC | PRN

## 2017-12-20 MED ORDER — OXYTOCIN BOLUS FROM INFUSION
500.0000 mL | Freq: Once | INTRAVENOUS | Status: AC
  Administered 2017-12-21: 500 mL via INTRAVENOUS

## 2017-12-20 MED ORDER — ONDANSETRON HCL 4 MG/2ML IJ SOLN: 4.0000 mg | Freq: Four times a day (QID) | INTRAMUSCULAR | Status: DC | PRN

## 2017-12-20 MED ORDER — OXYCODONE-ACETAMINOPHEN 5-325 MG PO TABS
2.0000 | ORAL_TABLET | ORAL | Status: DC | PRN
  Filled 2017-12-20: qty 2

## 2017-12-20 MED ORDER — OXYTOCIN 40 UNITS IN LACTATED RINGERS INFUSION - SIMPLE MED: 2.5000 [IU]/h | INTRAVENOUS | Status: DC

## 2017-12-20 NOTE — H&P (Addendum)
OBSTETRIC ADMISSION HISTORY AND PHYSICAL  Morgan Lee is a 37 y.o. female [phone] with IUP at [redacted]w[redacted]d by [place] presenting for IOL for GHTN . She reports +FMs, No LOF, no VB, no blurry vision, headaches or peripheral edema, and RUQ pain.  She plans on breast  feeding. She request BTL for birth control. She [facility] at [facility]   Dating: By [place] --->  Estimated Date of Delivery: 12/20/17  Sono:    @[redacted]w[redacted]d , CWD, normal anatomy, cephalic presentation, posterior , 2604g, 79% EFW   Prenatal History/Complications:  Past Medical History: Past Medical History:  Diagnosis Date  . [redacted] weeks gestation of pregnancy   . Advanced maternal age in multigravida, second trimester   . Depression   . Fatigue   . 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 Medical history non-contributory   . Palpitation   . SOB (shortness of breath)   . Syncope     Past Surgical History: Past Surgical History:  Procedure Laterality Date  . CESAREAN SECTION    . HERNIA REPAIR      Obstetrical History: OB History    Gravida Para Term Preterm AB Living   11 3 3   7 3    SAB TAB Ectopic Multiple Live Births   1 6            Social History: Social History   Socioeconomic History  . Marital status: Single    Spouse name: Not on file  . Number of children: Not on file  . Years of education: Not on file  . Highest education level: Not on file  Social Needs  . Financial resource strain: Not on file  . Food insecurity - worry: Not on file  . Food insecurity - inability: Not on file  . Transportation needs - medical: Not on file  . Transportation needs - non-medical: Not on file  Occupational History  . Not on file  Tobacco Use  . Smoking status: Former Games developer  . Smokeless tobacco: Never Used  Substance and Sexual Activity  . Alcohol use: Yes    Comment: occ./none with preg  . Drug use: Yes    Types: Marijuana    Comment: daily  . Sexual activity: Yes  Other Topics Concern  . Not on file  Social  History Narrative  . Not on file    Family History: Family History  Problem Relation Age of Onset  . Hypertension Mother   . Cancer - Lung Mother   . Hypertension Sister   . Diabetes Sister   . Seizures Sister   . Thyroid disease Sister   . Heart disease Sister        stents  . Asthma Sister   . Hypertension Brother     Allergies: No Known Allergies  Medications Prior to Admission  Medication Sig Dispense Refill Last Dose  . ondansetron (ZOFRAN) 4 MG tablet Take 1 tablet (4 mg total) by mouth every 8 (eight) hours as needed for nausea or vomiting. 20 tablet 0 Taking  . Prenatal Vit-Fe Fumarate-FA (PRENATAL VITAMIN PO) Take by mouth.   Taking     Review of Systems   All systems reviewed and negative except as stated in HPI  Last menstrual period 03/04/2017. General appearance: alert, cooperative and appears stated age Lungs: clear to auscultation bilaterally Heart: regular rate and rhythm Abdomen: soft, non-tender; bowel sounds normal Pelvic: 5/70/-1 Extremities: Homans sign is negative, no sign of DVT DTR's intact  Presentation: cephalic  Fetal monitoringBaseline: 150 bpm Uterine activityFrequency irregular      Prenatal labs: ABO, Rh: O/Positive/-- (07/30 0000) Antibody: Negative (07/30 0000) Rubella: Immune (07/30 0000) RPR: Nonreactive (07/30 0000)  HBsAg: Negative (07/30 0000)  HIV: Non-reactive (07/30 0000)  GBS: Negative (12/28 0000)  1 hr Glucola normal Genetic screening  Neg  Anatomy US normal  Prenatal Transfer Tool  Maternal Diabetes: No Genetic Screening: Normal Maternal Ultrasounds/Referrals: Normal Fetal Ultrasounds or other Referrals:  None Maternal Substance Abuse:  Yes:  Type: Marijuana Significant Maternal Medications:  None Significant Maternal Lab Results: None  No results found for this or any previous visit (from the past 24 hour(s)).  Patient Active Problem List   Diagnosis Date Noted  . Indication for care in labor or  delivery 12/20/2017  . [redacted] weeks gestation of pregnancy   . Advanced maternal age in multigravida, second trimester     Assessment/Plan:  Morgan Lee is a 37 y.o. [id number] at 1141w0d here for IOL for HTN  #Labor: patient contracting and progressing, consider pitocin  #Pain: Per patient request  #FWB: Category I #ID:  neg #MOF: breast  #MOC:BTL #Circ:  Outpatient   Nigel Bridgemanourtland Winborne, MD  12/20/2017, 6:18 PM  Attestation of Attending Supervision of Advanced Practitioner (CNM/NP): Evaluation and management procedures were performed by the Advanced Practitioner under my supervision and collaboration.  I have reviewed the Advanced Practitioner's note and chart, and I agree with the management and plan.  Morgan Lee 12:08 PM

## 2017-12-20 NOTE — Progress Notes (Deleted)
  The note originally documented on this encounter has been moved the the encounter in which it belongs.  

## 2017-12-20 NOTE — Anesthesia Pain Management Evaluation Note (Signed)
  [facility] Pain Management Visit Note  Patient: Morgan CoventryLillian Dowler, 37 y.o., female  "Hello I am a member of the anesthesia team at [facility]. We have an anesthesia team available at all times to provide care throughout the hospital, including epidural management and anesthesia for C-section. I don't know your plan for the delivery whether it a natural birth, water birth, IV sedation, nitrous supplementation, doula or epidural, but we want to meet your pain goals."   1.Was your pain managed to your expectations on prior hospitalizations?   Yes Did not use pain medication with other deliveries.  2.What is your expectation for pain management during this hospitalization?     IV pain meds if needed.  3.How can we help you reach that goal? Planning IV medication if need something for pain.  Record the patient's initial score and the patient's pain goal.   Pain: 5  Pain Goal: 8 [facility] wants you to be able to say your pain was always managed very well.  Lavern Maslow 12/20/2017

## 2017-12-20 NOTE — Progress Notes (Signed)
Labor Progress Note Huel CoventryLillian Myer is a 37 y.o. [id number] at 6824w0d presented for IOL for gHTN (VBAC) S: no complaints  O:  BP (!) 121/96   Pulse (!) 102   Temp 97.9 F (36.6 C) (Oral)   Resp 16   Ht 5\' 3"  (1.6 m)   Wt 77.3 kg (170 lb 8 oz)   LMP 03/04/2017 (Approximate)   BMI 30.20 kg/m  EFM: 140/mod var/+accels, no decels  CVE: Dilation: 5 Effacement (%): 50 Station: -2 Presentation: Vertex Exam by:: Drenda FreezeFran, CNM   A&P: 37 y.o. [id number] 2524w0d IOL for gHTN (VBAC) #Labor: Progressing well. Membranes stripped with last exam. Continue pitocin #Pain: fentanyl PRN #FWB: reactive NST #GBS negative  Alroy BailiffParker W Marisal Swarey, MD 9:17 PM

## 2017-12-21 ENCOUNTER — Inpatient Hospital Stay (HOSPITAL_COMMUNITY): Payer: BLUE CROSS/BLUE SHIELD | Admitting: Anesthesiology

## 2017-12-21 ENCOUNTER — Encounter (HOSPITAL_COMMUNITY)
Admission: AD | Disposition: A | Discharge status: Home / Self Care | Payer: Self-pay | Source: Ambulatory Visit | Attending: Obstetrics & Gynecology

## 2017-12-21 ENCOUNTER — Encounter (HOSPITAL_COMMUNITY): Payer: Self-pay | Admitting: *Deleted

## 2017-12-21 ENCOUNTER — Encounter (HOSPITAL_COMMUNITY): Payer: Self-pay

## 2017-12-21 DIAGNOSIS — O134 Gestational [pregnancy-induced] hypertension without significant proteinuria, complicating childbirth: Secondary | ICD-10-CM

## 2017-12-21 DIAGNOSIS — Z302 Encounter for sterilization: Secondary | ICD-10-CM

## 2017-12-21 DIAGNOSIS — Z3A40 40 weeks gestation of pregnancy: Secondary | ICD-10-CM

## 2017-12-21 HISTORY — PX: TUBAL LIGATION: SHX77

## 2017-12-21 LAB — CBC
HCT: 23.3 % — ABNORMAL LOW (ref 36.0–46.0)
Hemoglobin: 7.6 g/dL — ABNORMAL LOW (ref 12.0–15.0)
MCH: 23 pg — AB (ref 26.0–34.0)
MCHC: 32.6 g/dL (ref 30.0–36.0)
MCV: 70.6 fL — AB (ref 78.0–100.0)
PLATELETS: 213 10*3/uL (ref 150–400)
RBC: 3.3 MIL/uL — AB (ref 3.87–5.11)
RDW: 15.2 % (ref 11.5–15.5)
WBC: 8.9 10*3/uL (ref 4.0–10.5)

## 2017-12-21 LAB — RPR: RPR: NONREACTIVE

## 2017-12-21 SURGERY — LIGATION, FALLOPIAN TUBE, POSTPARTUM
Anesthesia: Spinal | Laterality: Bilateral

## 2017-12-21 MED ORDER — COCONUT OIL OIL: 1.0000 "application " | TOPICAL_OIL | Status: DC | PRN

## 2017-12-21 MED ORDER — OXYCODONE HCL 5 MG PO TABS
10.0000 mg | ORAL_TABLET | ORAL | Status: DC | PRN
  Administered 2017-12-21 – 2017-12-22 (×5): 10 mg via ORAL
  Filled 2017-12-21 (×5): qty 2

## 2017-12-21 MED ORDER — FENTANYL CITRATE (PF) 100 MCG/2ML IJ SOLN
25.0000 ug | INTRAMUSCULAR | Status: DC | PRN
  Administered 2017-12-21: 25 ug via INTRAVENOUS
  Administered 2017-12-21: 50 ug via INTRAVENOUS

## 2017-12-21 MED ORDER — ONDANSETRON HCL 4 MG/2ML IJ SOLN
4.0000 mg | INTRAMUSCULAR | Status: DC | PRN
  Administered 2017-12-21: 4 mg via INTRAVENOUS
  Filled 2017-12-21: qty 2

## 2017-12-21 MED ORDER — FAMOTIDINE 20 MG PO TABS
40.0000 mg | ORAL_TABLET | Freq: Once | ORAL | Status: AC
  Administered 2017-12-21: 40 mg via ORAL
  Filled 2017-12-21: qty 2

## 2017-12-21 MED ORDER — LACTATED RINGERS IV SOLN
INTRAVENOUS | Status: DC
  Administered 2017-12-21: 08:00:00 via INTRAVENOUS

## 2017-12-21 MED ORDER — SIMETHICONE 80 MG PO CHEW: 80.0000 mg | CHEWABLE_TABLET | ORAL | Status: DC | PRN

## 2017-12-21 MED ORDER — TETANUS-DIPHTH-ACELL PERTUSSIS 5-2.5-18.5 LF-MCG/0.5 IM SUSP: 0.5000 mL | Freq: Once | INTRAMUSCULAR | Status: DC

## 2017-12-21 MED ORDER — FENTANYL CITRATE (PF) 100 MCG/2ML IJ SOLN
INTRAMUSCULAR | Status: AC
  Administered 2017-12-21: 50 ug via INTRAVENOUS
  Filled 2017-12-21: qty 2

## 2017-12-21 MED ORDER — SODIUM CHLORIDE 0.9 % IR SOLN
Status: DC | PRN
  Administered 2017-12-21: 1

## 2017-12-21 MED ORDER — DOCUSATE SODIUM 100 MG PO CAPS
100.0000 mg | ORAL_CAPSULE | Freq: Two times a day (BID) | ORAL | Status: DC
  Administered 2017-12-21 – 2017-12-22 (×2): 100 mg via ORAL
  Filled 2017-12-21 (×2): qty 1

## 2017-12-21 MED ORDER — MIDAZOLAM HCL 2 MG/2ML IJ SOLN
INTRAMUSCULAR | Status: AC
  Filled 2017-12-21: qty 2

## 2017-12-21 MED ORDER — FENTANYL CITRATE (PF) 100 MCG/2ML IJ SOLN
INTRAMUSCULAR | Status: AC
  Filled 2017-12-21: qty 2

## 2017-12-21 MED ORDER — PROMETHAZINE HCL 25 MG/ML IJ SOLN: 6.2500 mg | INTRAMUSCULAR | Status: DC | PRN

## 2017-12-21 MED ORDER — IBUPROFEN 600 MG PO TABS
600.0000 mg | ORAL_TABLET | Freq: Four times a day (QID) | ORAL | Status: DC
  Filled 2017-12-21: qty 1

## 2017-12-21 MED ORDER — METHYLERGONOVINE MALEATE 0.2 MG/ML IJ SOLN: 0.2000 mg | INTRAMUSCULAR | Status: DC | PRN

## 2017-12-21 MED ORDER — METHYLERGONOVINE MALEATE 0.2 MG PO TABS: 0.2000 mg | ORAL_TABLET | ORAL | Status: DC | PRN

## 2017-12-21 MED ORDER — FLEET ENEMA 7-19 GM/118ML RE ENEM: 1.0000 | ENEMA | Freq: Every day | RECTAL | Status: DC | PRN

## 2017-12-21 MED ORDER — OXYCODONE HCL 5 MG PO TABS: 5.0000 mg | ORAL_TABLET | ORAL | Status: DC | PRN

## 2017-12-21 MED ORDER — BISACODYL 10 MG RE SUPP: 10.0000 mg | Freq: Every day | RECTAL | Status: DC | PRN

## 2017-12-21 MED ORDER — PRENATAL MULTIVITAMIN CH: 1.0000 | ORAL_TABLET | Freq: Every day | ORAL | Status: DC

## 2017-12-21 MED ORDER — BUPIVACAINE HCL (PF) 0.25 % IJ SOLN
INTRAMUSCULAR | Status: DC | PRN
  Administered 2017-12-21: 10 mL

## 2017-12-21 MED ORDER — FENTANYL CITRATE (PF) 100 MCG/2ML IJ SOLN
100.0000 ug | INTRAMUSCULAR | Status: DC | PRN
  Administered 2017-12-21: 100 ug via INTRAVENOUS
  Filled 2017-12-21: qty 2

## 2017-12-21 MED ORDER — BENZOCAINE-MENTHOL 20-0.5 % EX AERO
1.0000 "application " | INHALATION_SPRAY | CUTANEOUS | Status: DC | PRN
  Administered 2017-12-21: 1 via TOPICAL
  Filled 2017-12-21: qty 56

## 2017-12-21 MED ORDER — BUPIVACAINE HCL (PF) 0.25 % IJ SOLN
INTRAMUSCULAR | Status: AC
  Filled 2017-12-21: qty 30

## 2017-12-21 MED ORDER — LACTATED RINGERS IV SOLN
INTRAVENOUS | Status: DC | PRN
  Administered 2017-12-21 (×2): via INTRAVENOUS

## 2017-12-21 MED ORDER — COMPLETENATE 29-1 MG PO CHEW
1.0000 | CHEWABLE_TABLET | Freq: Every day | ORAL | Status: DC
  Administered 2017-12-21 – 2017-12-22 (×2): 1 via ORAL
  Filled 2017-12-21 (×3): qty 1

## 2017-12-21 MED ORDER — MIDAZOLAM HCL 5 MG/5ML IJ SOLN
INTRAMUSCULAR | Status: DC | PRN
  Administered 2017-12-21 (×2): 1 mg via INTRAVENOUS

## 2017-12-21 MED ORDER — KETOROLAC TROMETHAMINE 30 MG/ML IJ SOLN
INTRAMUSCULAR | Status: DC | PRN
  Administered 2017-12-21: 30 mg via INTRAVENOUS

## 2017-12-21 MED ORDER — ONDANSETRON HCL 4 MG PO TABS
4.0000 mg | ORAL_TABLET | ORAL | Status: DC | PRN
  Administered 2017-12-21 – 2017-12-22 (×2): 4 mg via ORAL
  Filled 2017-12-21 (×2): qty 1

## 2017-12-21 MED ORDER — BUPIVACAINE IN DEXTROSE 0.75-8.25 % IT SOLN
INTRATHECAL | Status: DC | PRN
  Administered 2017-12-21: 1.4 mL via INTRATHECAL

## 2017-12-21 MED ORDER — ACETAMINOPHEN 325 MG PO TABS: 650.0000 mg | ORAL_TABLET | ORAL | Status: DC | PRN

## 2017-12-21 MED ORDER — METOCLOPRAMIDE HCL 10 MG PO TABS
10.0000 mg | ORAL_TABLET | Freq: Once | ORAL | Status: AC
  Administered 2017-12-21: 10 mg via ORAL
  Filled 2017-12-21: qty 1

## 2017-12-21 MED ORDER — MEASLES, MUMPS & RUBELLA VAC ~~LOC~~ INJ: 0.5000 mL | INJECTION | Freq: Once | SUBCUTANEOUS | Status: DC

## 2017-12-21 MED ORDER — IBUPROFEN 100 MG/5ML PO SUSP
600.0000 mg | Freq: Four times a day (QID) | ORAL | Status: DC
  Administered 2017-12-21 – 2017-12-22 (×5): 600 mg via ORAL
  Filled 2017-12-21 (×9): qty 30

## 2017-12-21 MED ORDER — FENTANYL CITRATE (PF) 100 MCG/2ML IJ SOLN
INTRAMUSCULAR | Status: DC | PRN
  Administered 2017-12-21 (×2): 50 ug via INTRAVENOUS

## 2017-12-21 MED ORDER — ONDANSETRON HCL 4 MG/2ML IJ SOLN
INTRAMUSCULAR | Status: DC | PRN
  Administered 2017-12-21: 4 mg via INTRAVENOUS

## 2017-12-21 MED ORDER — ZOLPIDEM TARTRATE 5 MG PO TABS: 5.0000 mg | ORAL_TABLET | Freq: Every evening | ORAL | Status: DC | PRN

## 2017-12-21 MED ORDER — ONDANSETRON HCL 4 MG/2ML IJ SOLN
INTRAMUSCULAR | Status: AC
  Filled 2017-12-21: qty 2

## 2017-12-21 MED ORDER — WITCH HAZEL-GLYCERIN EX PADS: 1.0000 "application " | MEDICATED_PAD | CUTANEOUS | Status: DC | PRN

## 2017-12-21 MED ORDER — DIPHENHYDRAMINE HCL 25 MG PO CAPS: 25.0000 mg | ORAL_CAPSULE | Freq: Four times a day (QID) | ORAL | Status: DC | PRN

## 2017-12-21 MED ORDER — DIBUCAINE 1 % RE OINT: 1.0000 "application " | TOPICAL_OINTMENT | RECTAL | Status: DC | PRN

## 2017-12-21 MED ORDER — FERROUS SULFATE 325 (65 FE) MG PO TABS
325.0000 mg | ORAL_TABLET | Freq: Two times a day (BID) | ORAL | Status: DC
  Administered 2017-12-22: 325 mg via ORAL
  Filled 2017-12-21: qty 1

## 2017-12-21 SURGICAL SUPPLY — 26 items
CLIP FILSHIE TUBAL LIGA STRL (Clip) ×3 IMPLANT
CLOTH BEACON ORANGE TIMEOUT ST (SAFETY) ×3 IMPLANT
DRSG OPSITE POSTOP 3X4 (GAUZE/BANDAGES/DRESSINGS) ×3 IMPLANT
DURAPREP 26ML APPLICATOR (WOUND CARE) ×3 IMPLANT
GLOVE BIO SURGEON STRL SZ7.5 (GLOVE) ×3 IMPLANT
GLOVE BIOGEL PI IND STRL 6 (GLOVE) ×1 IMPLANT
GLOVE BIOGEL PI IND STRL 7.0 (GLOVE) ×2 IMPLANT
GLOVE BIOGEL PI INDICATOR 6 (GLOVE) ×2
GLOVE BIOGEL PI INDICATOR 7.0 (GLOVE) ×4
GLOVE ECLIPSE 6.5 STRL STRAW (GLOVE) ×3 IMPLANT
GOWN STRL REUS W/TWL LRG LVL3 (GOWN DISPOSABLE) ×3 IMPLANT
GOWN STRL REUS W/TWL XL LVL3 (GOWN DISPOSABLE) ×3 IMPLANT
GOWN SURGICAL LARGE (GOWNS) ×3 IMPLANT
NEEDLE HYPO 22GX1.5 SAFETY (NEEDLE) ×6 IMPLANT
NS IRRIG 1000ML POUR BTL (IV SOLUTION) ×3 IMPLANT
PACK ABDOMINAL MINOR (CUSTOM PROCEDURE TRAY) ×3 IMPLANT
PROTECTOR NERVE ULNAR (MISCELLANEOUS) ×3 IMPLANT
SPONGE LAP 4X18 X RAY DECT (DISPOSABLE) IMPLANT
SUT MNCRL AB 4-0 PS2 18 (SUTURE) ×3 IMPLANT
SUT PLAIN 0 NONE (SUTURE) ×3 IMPLANT
SUT VIC AB 0 CT1 27 (SUTURE) ×2
SUT VIC AB 0 CT1 27XBRD ANBCTR (SUTURE) ×1 IMPLANT
SYR CONTROL 10ML LL (SYRINGE) ×6 IMPLANT
TOWEL OR 17X24 6PK STRL BLUE (TOWEL DISPOSABLE) ×6 IMPLANT
TRAY FOLEY CATH SILVER 14FR (SET/KITS/TRAYS/PACK) ×3 IMPLANT
WATER STERILE IRR 1000ML POUR (IV SOLUTION) ×3 IMPLANT

## 2017-12-21 NOTE — Op Note (Signed)
Morgan Lee 12/21/2017  PREOPERATIVE DIAGNOSES: Multiparity, undesired fertility  POSTOPERATIVE DIAGNOSES: Multiparity, undesired fertility  PROCEDURE:  Postpartum Bilateral Tubal Sterilization using Filshie Clips   SURGEON: Dr. Nettie ElmMichael Ervin FELLOW: Dr. Rolm BookbinderAmber Braylyn Eye  ANESTHESIA:  Epidural and local analgesia using 30 ml of 0.25% bupivicaine  COMPLICATIONS:  None immediate.  ESTIMATED BLOOD LOSS: 5 ml.  FLUIDS: 1000 ml LR.  URINE OUTPUT:  100 ml of clear urine.  INDICATIONS:  37 y.o. [id number] with undesired fertility,status post vaginal delivery, desires permanent sterilization.  Other reversible forms of contraception were discussed with patient; she declines all other modalities. Risks of procedure discussed with patient including but not limited to: risk of regret, permanence of method, bleeding, infection, injury to surrounding organs and need for additional procedures.  Failure risk of 1 -2 % with increased risk of ectopic gestation if pregnancy occurs was also discussed with patient.      FINDINGS:  Normal uterus, tubes, and ovaries.  PROCEDURE DETAILS: The patient was taken to the operating room where her epidural anesthesia was dosed up to surgical level and found to be adequate.  She was then placed in the dorsal supine position and prepped and draped in sterile fashion.  After an adequate timeout was performed, attention was turned to the patient's abdomen where a small transverse skin incision was made under the umbilical fold. The incision was taken down to the layer of fascia using the scalpel, and fascia was incised, and extended bilaterally using Mayo scissors. The peritoneum was entered in a sharp fashion. Attention was then turned to the patient's uterus, and left fallopian tube was identified and followed out to the fimbriated end.  A Filshie clip was placed on the left fallopian tube about 3 cm from the cornual attachment, with care given to incorporate the underlying  mesosalpinx.  A similar process was carried out on the right side allowing for bilateral tubal sterilization.  Good hemostasis was noted overall. The instruments were then removed from the patient's abdomen and the fascial incision was repaired with 0 Vicryl, and the skin was closed with a 4-0 Vicryl subcuticular stitch. The patient tolerated the procedure well.  Instrument, sponge, and needle counts were correct times two.  The patient was then taken to the recovery room awake and in stable condition.   Rolm BookbinderAmber Shanell Aden, DO Faculty Practice, [facility]

## 2017-12-21 NOTE — Anesthesia Procedure Notes (Signed)
Spinal  Patient location during procedure: OR Start time: 12/21/2017 9:39 AM End time: 12/21/2017 9:41 AM Staffing Anesthesiologist: Cecile Hearingurk, Stephen Edward, MD Performed: anesthesiologist  Preanesthetic Checklist Completed: patient identified, surgical consent, pre-op evaluation, timeout performed, IV checked, risks and benefits discussed and monitors and equipment checked Spinal Block Patient position: sitting Prep: site prepped and draped and DuraPrep Patient monitoring: continuous pulse ox and blood pressure Approach: midline Location: L3-4 Injection technique: single-shot Needle Needle type: Pencan  Needle gauge: 24 G Assessment Sensory level: T6 Additional Notes Functioning IV was confirmed and monitors were applied. Sterile prep and drape, including hand hygiene, mask and sterile gloves were used. The patient was positioned and the spine was prepped. The skin was anesthetized with lidocaine.  Free flow of clear CSF was obtained prior to injecting local anesthetic into the CSF.  The spinal needle aspirated freely following injection.  The needle was carefully withdrawn.  The patient tolerated the procedure well. Consent was obtained prior to procedure with all questions answered and concerns addressed. Risks including but not limited to bleeding, infection, nerve damage, paralysis, failed block, inadequate analgesia, allergic reaction, high spinal, itching and headache were discussed and the patient wished to proceed.   Arrie AranStephen Turk, MD

## 2017-12-21 NOTE — Anesthesia Postprocedure Evaluation (Addendum)
Anesthesia Post Note  Patient: Morgan Lee  Procedure(s) Performed: POST PARTUM TUBAL LIGATION (Bilateral )     Patient location during evaluation: PACU Anesthesia Type: Spinal Level of consciousness: oriented, awake and alert, awake and patient cooperative Pain management: pain level controlled Vital Signs Assessment: post-procedure vital signs reviewed and stable Respiratory status: spontaneous breathing, respiratory function stable, patient connected to nasal cannula oxygen and nonlabored ventilation Cardiovascular status: blood pressure returned to baseline and stable Postop Assessment: no headache, no backache, no apparent nausea or vomiting, spinal receding and patient able to bend at knees Anesthetic complications: no    Last Vitals:  Vitals:   12/21/17 1305 12/21/17 1353  BP: (!) 130/91 133/88  Pulse: 76 83  Resp: 17 20  Temp: (!) 36.2 C 36.9 C  SpO2: 99% 100%    Last Pain:  Vitals:   12/21/17 1353  TempSrc: Oral  PainSc:    Pain Goal:                 Cecile HearingStephen Edward Damoni Erker

## 2017-12-21 NOTE — Progress Notes (Signed)
Patient ID: Morgan Lee, female   DOB: Apr 17, 1981, 37 y.o.   MRN: [id number] Pt desires PP BTL. Papers have been signed and are on the chart PPBTL reviewed with pt. H/O c setion and umbilical hernia repair as child unknown if mesh use R/B/Options and failure rate reviewed with pt Pt verbalized understanding and desires to proceed. To OR as scheduled.

## 2017-12-21 NOTE — Anesthesia Postprocedure Evaluation (Signed)
Anesthesia Post Note  Patient: Huel CoventryLillian Howerter  Procedure(s) Performed: POST PARTUM TUBAL LIGATION (Bilateral )     Patient location during evaluation: Mother Baby Anesthesia Type: Spinal Level of consciousness: awake and alert and oriented Pain management: satisfactory to patient Vital Signs Assessment: post-procedure vital signs reviewed and stable Respiratory status: spontaneous breathing and nonlabored ventilation Cardiovascular status: stable Postop Assessment: no headache, no backache, no signs of nausea or vomiting, adequate PO intake and patient able to bend at knees (patient up walking) Anesthetic complications: no    Last Vitals:  Vitals:   12/21/17 1305 12/21/17 1353  BP: (!) 130/91 133/88  Pulse: 76 83  Resp: 17 20  Temp: (!) 36.2 C 36.9 C  SpO2: 99% 100%    Last Pain:  Vitals:   12/21/17 1353  TempSrc: Oral  PainSc:    Pain Goal:                 Gustava Berland

## 2017-12-21 NOTE — Addendum Note (Signed)
Addendum  created 12/21/17 1820 by Shanon PayorGregory, Hoover Grewe M, CRNA   Charge Capture section accepted, Sign clinical note

## 2017-12-21 NOTE — Anesthesia Preprocedure Evaluation (Signed)
Anesthesia Evaluation  Patient identified by MRN, date of birth, ID band Patient awake    Reviewed: Allergy & Precautions, NPO status , Patient's Chart, lab work & pertinent test results  Airway Mallampati: II  TM Distance: >3 FB Neck ROM: Full    Dental  (+) Teeth Intact, Dental Advisory Given   Pulmonary former smoker,    Pulmonary exam normal breath sounds clear to auscultation       Cardiovascular negative cardio ROS Normal cardiovascular exam Rhythm:Regular Rate:Normal     Neuro/Psych  Headaches, PSYCHIATRIC DISORDERS Depression    GI/Hepatic negative GI ROS, Neg liver ROS,   Endo/Other  negative endocrine ROSObesity   Renal/GU negative Renal ROS     Musculoskeletal negative musculoskeletal ROS (+)   Abdominal   Peds  Hematology  (+) Blood dyscrasia, anemia , Plt 266k on 12/20/17   Anesthesia Other Findings Day of surgery medications reviewed with the patient.  Reproductive/Obstetrics S/p SVD 12/21/17 @0404                              Anesthesia Physical Anesthesia Plan  ASA: II  Anesthesia Plan: Spinal   Post-op Pain Management:    Induction:   PONV Risk Score and Plan: 2 and Propofol infusion, Ondansetron and Dexamethasone  Airway Management Planned:   Additional Equipment:   Intra-op Plan:   Post-operative Plan:   Informed Consent: I have reviewed the patients History and Physical, chart, labs and discussed the procedure including the risks, benefits and alternatives for the proposed anesthesia with the patient or authorized representative who has indicated his/her understanding and acceptance.   Dental advisory given  Plan Discussed with: CRNA  Anesthesia Plan Comments: (Discussed risks and benefits of and differences between spinal and general. Discussed risks of spinal including headache, backache, failure, bleeding, infection, and nerve damage. Patient consents  to spinal. Questions answered. Coagulation studies and platelet count acceptable.)        Anesthesia Quick Evaluation

## 2017-12-21 NOTE — Transfer of Care (Signed)
Immediate Anesthesia Transfer of Care Note  Patient: Huel CoventryLillian Poma  Procedure(s) Performed: POST PARTUM TUBAL LIGATION (Bilateral )  Patient Location: PACU  Anesthesia Type:Spinal  Level of Consciousness: awake, alert  and oriented  Airway & Oxygen Therapy: Patient Spontanous Breathing  Post-op Assessment: Report given to RN and Post -op Vital signs reviewed and stable  Post vital signs: Reviewed and stable  Last Vitals:  Vitals:   12/21/17 0730 12/21/17 0840  BP: 124/83 (!) 135/95  Pulse: 85 96  Resp: 20 18  Temp: 37.2 C 37 C    Last Pain:  Vitals:   12/21/17 0840  TempSrc: Oral  PainSc: 7          Complications: No apparent anesthesia complications

## 2017-12-22 DIAGNOSIS — O139 Gestational [pregnancy-induced] hypertension without significant proteinuria, unspecified trimester: Secondary | ICD-10-CM

## 2017-12-22 MED ORDER — IBUPROFEN 800 MG PO TABS: 800.0000 mg | ORAL_TABLET | Freq: Three times a day (TID) | ORAL | 1 refills | Status: DC | PRN

## 2017-12-22 MED ORDER — OXYCODONE HCL 10 MG PO TABS: 10.0000 mg | ORAL_TABLET | ORAL | 0 refills | Status: DC | PRN

## 2017-12-22 NOTE — Progress Notes (Signed)
POSTPARTUM PROGRESS NOTE  Post Partum Day 1  Subjective:  Morgan Lee is a 37 y.o. [id number] s/p VBAC, BTL at 4433w1d.  No acute events overnight.  Pt denies problems with ambulating, voiding or po intake.  She denies nausea or vomiting.  Pain is moderately controlled.  She has had flatus. She has not had bowel movement.  Lochia Minimal.   Objective: Blood pressure 133/86, pulse 83, temperature 98.2 F (36.8 C), temperature source Oral, resp. rate 20, height 5\' 3"  (1.6 m), weight 77.3 kg (170 lb 8 oz), last menstrual period 03/04/2017, SpO2 100 %, unknown if currently breastfeeding.  Physical Exam:  General: alert, cooperative and no distress Chest: no respiratory distress Heart:regular rate, distal pulses intact Abdomen: soft, nontender,  Uterine Fundus: firm, appropriately tender DVT Evaluation: No calf swelling or tenderness Extremities: trace edema Skin: warm, dry; incision clean, dry, intact  Recent Labs    12/20/17 1824 12/21/17 0914  HGB 9.2* 7.6*  HCT 28.8* 23.3*    Assessment/Plan: Morgan Lee is a 37 y.o. [id number] s/p VBAC, BTL at 2733w1d   PPD#1 - Doing well, continue expectant management Contraception: BTL Feeding: bottle Dispo: Plan for discharge tomorrow.   LOS: 2 days   Alroy BailiffParker W Leland MD 12/22/2017, 5:14 AM    I confirm that I have verified the information documented in the resident's note and that I have also personally reperformed the physical exam and all medical decision making activities.   Luna KitchensKathryn June Vacha CNM

## 2017-12-22 NOTE — Discharge Summary (Signed)
OB Discharge Summary     Patient Name: Morgan CoventryLillian Lee DOB: 1981/07/21 MRN: [id number]  Date of admission: 12/20/2017 Delivering MD: Alroy BailiffLELAND, PARKER W   Date of discharge: 12/22/2017  Admitting diagnosis: INDUCTION Intrauterine pregnancy: 8266w1d     Secondary diagnosis:  Active Problems:   Indication for care in labor or delivery   Gestational hypertension  Additional problems:  Depression, AMA    Gestational hypertension Discharge diagnosis: Term Pregnancy Delivered                                                                                                Post partum procedures:postpartum tubal ligation  Augmentation: AROM and Pitocin  Complications: None  Hospital course:  Induction of Labor With Vaginal Delivery   37 y.o. yo [id number] at 7466w1d was admitted to the hospital 12/20/2017 for induction of labor.  Indication for induction: Gestational hypertension.  Patient had an uncomplicated labor course as follows: Membrane Rupture Time/Date: 2:25 AM ,12/21/2017   Intrapartum Procedures: Episiotomy: None [1]                                         Lacerations:  None [1]  Patient had delivery of a Viable infant.  Information for the patient's newborn:  Morgan Lee, Boy Morgan Lee [[id number]][[id number]]  Delivery Method: VBAC, Spontaneous(Filed from Delivery Summary)   12/21/2017  Details of delivery can be found in separate delivery note.  Patient had a routine postpartum course. Patient is discharged home 12/22/17.  Physical exam  Vitals:   12/21/17 1810 12/21/17 2200 12/22/17 0229 12/22/17 0550  BP: (!) 143/94 (!) 140/97 133/86 116/72  Pulse: 77 86 83 71  Resp: 20 20 20 20   Temp: 97.7 F (36.5 C) 98.5 F (36.9 C) 98.2 F (36.8 C) 98.4 F (36.9 C)  TempSrc: Oral Oral Oral Oral  SpO2:      Weight:      Height:       General: alert, cooperative and no distress Lochia: appropriate Uterine Fundus: firm Incision: N/A DVT Evaluation: No evidence of DVT seen on physical exam. Labs: Lab  Results  Component Value Date   WBC 8.9 12/21/2017   HGB 7.6 (L) 12/21/2017   HCT 23.3 (L) 12/21/2017   MCV 70.6 (L) 12/21/2017   PLT 213 12/21/2017   CMP Latest Ref Rng & Units 12/20/2017  Glucose 65 - 99 mg/dL 72  BUN 6 - 20 mg/dL 7  Creatinine 3.080.44 - 6.571.00 mg/dL 8.460.56  Sodium 962135 - 952145 mmol/L 133(L)  Potassium 3.5 - 5.1 mmol/L 3.9  Chloride 101 - 111 mmol/L 100(L)  CO2 22 - 32 mmol/L 21(L)  Calcium 8.9 - 10.3 mg/dL 8.4(X8.6(L)  Total Protein 6.5 - 8.1 g/dL 7.1  Total Bilirubin 0.3 - 1.2 mg/dL 0.3  Alkaline Phos 38 - 126 U/L 165(H)  AST 15 - 41 U/L 29  ALT 14 - 54 U/L 19   -Patient's bp has been normal since delivery; patient discharged without any medications.    Discharge instruction: per  After Visit Summary and "Baby and Me Booklet".  After visit meds:  Allergies as of 12/22/2017   No Known Allergies     Medication List    TAKE these medications   ibuprofen 800 MG tablet Commonly known as:  ADVIL,MOTRIN Take 1 tablet (800 mg total) by mouth every 8 (eight) hours as needed.   Oxycodone HCl 10 MG Tabs Take 1 tablet (10 mg total) by mouth every 4 (four) hours as needed (pain scale > 7).   PRENATAL VITAMIN PO Take by mouth.       Diet: low salt diet  Activity: Advance as tolerated. Pelvic rest for 6 weeks.   Outpatient follow up:6 weeks  Follow up Appt: Future Appointments  Date Time Provider Department Center  01/16/2018 10:30 AM [facility] [place] [facility]   Follow up Visit:No Follow-up on file.  Patient to come to [facility] for a BP check in 1 week (msg sent to [facility] clinical pool to schedule)  Postpartum contraception: Tubal Ligation  Newborn Data: Live born female  Birth Weight: 8 lb 3.6 oz (3730 g) APGAR: 8, 9  Newborn Delivery   Birth date/time:  12/21/2017 04:04:00 Delivery type:  VBAC, Spontaneous     Baby Feeding: Breast Disposition:home with mother   12/22/2017 Morgan Lee, CNM

## 2017-12-24 ENCOUNTER — Inpatient Hospital Stay (HOSPITAL_COMMUNITY)
Admission: AD | Admit: 2017-12-24 | Discharge: 2017-12-24 | Disposition: A | Discharge status: Home / Self Care | Payer: BLUE CROSS/BLUE SHIELD | Source: Ambulatory Visit | Attending: Obstetrics & Gynecology | Admitting: Obstetrics & Gynecology

## 2017-12-24 ENCOUNTER — Encounter (HOSPITAL_COMMUNITY): Payer: Self-pay

## 2017-12-24 DIAGNOSIS — Z87891 Personal history of nicotine dependence: Secondary | ICD-10-CM | POA: Insufficient documentation

## 2017-12-24 DIAGNOSIS — M549 Dorsalgia, unspecified: Secondary | ICD-10-CM | POA: Insufficient documentation

## 2017-12-24 DIAGNOSIS — O9081 Anemia of the puerperium: Secondary | ICD-10-CM | POA: Insufficient documentation

## 2017-12-24 DIAGNOSIS — D649 Anemia, unspecified: Secondary | ICD-10-CM | POA: Diagnosis not present

## 2017-12-24 DIAGNOSIS — O135 Gestational [pregnancy-induced] hypertension without significant proteinuria, complicating the puerperium: Secondary | ICD-10-CM | POA: Diagnosis not present

## 2017-12-24 LAB — COMPREHENSIVE METABOLIC PANEL
ALT: 30 U/L (ref 14–54)
AST: 37 U/L (ref 15–41)
Albumin: 2.7 g/dL — ABNORMAL LOW (ref 3.5–5.0)
Alkaline Phosphatase: 111 U/L (ref 38–126)
Anion gap: 8 (ref 5–15)
BUN: 8 mg/dL (ref 6–20)
CHLORIDE: 104 mmol/L (ref 101–111)
CO2: 25 mmol/L (ref 22–32)
CREATININE: 0.58 mg/dL (ref 0.44–1.00)
Calcium: 8.3 mg/dL — ABNORMAL LOW (ref 8.9–10.3)
GFR calc non Af Amer: >60 mL/min (ref >60)
Glucose, Bld: 85 mg/dL (ref 65–99)
POTASSIUM: 3.7 mmol/L (ref 3.5–5.1)
SODIUM: 137 mmol/L (ref 135–145)
Total Bilirubin: 0.2 mg/dL — ABNORMAL LOW (ref 0.3–1.2)
Total Protein: 6.6 g/dL (ref 6.5–8.1)

## 2017-12-24 LAB — URINALYSIS, ROUTINE W REFLEX MICROSCOPIC
Bilirubin Urine: NEGATIVE
Glucose, UA: NEGATIVE mg/dL
KETONES UR: NEGATIVE mg/dL
Nitrite: NEGATIVE
PROTEIN: 30 mg/dL — AB
Specific Gravity, Urine: 1.021 (ref 1.005–1.030)
pH: 6 (ref 5.0–8.0)

## 2017-12-24 LAB — CBC
HEMATOCRIT: 22.6 % — AB (ref 36.0–46.0)
Hemoglobin: 7.3 g/dL — ABNORMAL LOW (ref 12.0–15.0)
MCH: 23.3 pg — AB (ref 26.0–34.0)
MCHC: 32.3 g/dL (ref 30.0–36.0)
MCV: 72.2 fL — AB (ref 78.0–100.0)
PLATELETS: 264 10*3/uL (ref 150–400)
RBC: 3.13 MIL/uL — AB (ref 3.87–5.11)
RDW: 15.7 % — ABNORMAL HIGH (ref 11.5–15.5)
WBC: 5.6 10*3/uL (ref 4.0–10.5)

## 2017-12-24 LAB — PROTEIN / CREATININE RATIO, URINE
Creatinine, Urine: 194 mg/dL
PROTEIN CREATININE RATIO: 0.1 mg/mg{creat} (ref 0.00–0.15)
Total Protein, Urine: 20 mg/dL

## 2017-12-24 MED ORDER — CYCLOBENZAPRINE HCL 10 MG PO TABS
10.0000 mg | ORAL_TABLET | Freq: Three times a day (TID) | ORAL | Status: DC | PRN
  Administered 2017-12-24: 10 mg via ORAL
  Filled 2017-12-24: qty 1

## 2017-12-24 MED ORDER — CYCLOBENZAPRINE HCL 10 MG PO TABS: 10.0000 mg | ORAL_TABLET | Freq: Three times a day (TID) | ORAL | 0 refills | Status: DC | PRN

## 2017-12-24 MED ORDER — POLYSACCHARIDE IRON COMPLEX 150 MG PO CAPS: 150.0000 mg | ORAL_CAPSULE | Freq: Every day | ORAL | 0 refills | Status: DC

## 2017-12-24 MED ORDER — ACETAMINOPHEN 500 MG PO TABS
1000.0000 mg | ORAL_TABLET | Freq: Four times a day (QID) | ORAL | Status: DC | PRN
  Administered 2017-12-24: 1000 mg via ORAL
  Filled 2017-12-24: qty 2

## 2017-12-24 MED ORDER — DOCUSATE SODIUM 100 MG PO CAPS: 100.0000 mg | ORAL_CAPSULE | Freq: Every day | ORAL | 0 refills | Status: DC | PRN

## 2017-12-24 MED ORDER — AMLODIPINE BESYLATE 5 MG PO TABS: 5.0000 mg | ORAL_TABLET | Freq: Every day | ORAL | 1 refills | Status: DC

## 2017-12-24 NOTE — MAU Provider Note (Signed)
History     CSN: [id number]  Arrival date and time: 12/24/17 1857   First Provider Initiated Contact with Patient 12/24/17 1940      Chief Complaint  Patient presents with  . Back Pain  . Pain   36 y.o. Female s/p SVD and BTL 3 days ago here with back spasms, leg swelling, and HA. Sx started today. HA is frontal. Has not taken anything for it. Back pain is intermittent, having spasm in middle level of back. Has used Oxycodone but has not fully relieved. Arms and legs also feel achy. Lochia is minimal. She is formula feeding. Her pregnancy was complicated by gestational HTN and anemia.   Past Medical History:  Diagnosis Date  . [redacted] weeks gestation of pregnancy   . Advanced maternal age in multigravida, second trimester   . Depression   . Fatigue   . 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 Palpitation   . SOB (shortness of breath)   . Syncope     Past Surgical History:  Procedure Laterality Date  . CESAREAN SECTION    . HERNIA REPAIR    . TUBAL LIGATION Bilateral 12/21/2017   Procedure: POST PARTUM TUBAL LIGATION;  Surgeon: Hermina Staggers, MD;  Location: [facility] BIRTHING SUITES;  Service: Gynecology;  Laterality: Bilateral;    Family History  Problem Relation Age of Onset  . Hypertension Mother   . Cancer - Lung Mother   . Hypertension Sister   . Diabetes Sister   . Seizures Sister   . Thyroid disease Sister   . Heart disease Sister        stents  . Asthma Sister   . Hypertension Brother     Social History   Tobacco Use  . Smoking status: Former Games developer  . Smokeless tobacco: Never Used  Substance Use Topics  . Alcohol use: Yes    Comment: occ./none with preg; stopped when preg  . Drug use: Yes    Types: Marijuana    Comment: daily; stopped once found out preg    Allergies: No Known Allergies  No medications prior to admission.    Review of Systems  Constitutional: Negative for chills and fever.  Eyes: Negative for visual disturbance.  Cardiovascular:  Positive for leg swelling.  Gastrointestinal: Negative for abdominal pain.  Neurological: Positive for headaches.   Physical Exam   Blood pressure (!) 146/93, pulse 78, temperature 98.2 F (36.8 C), temperature source Oral, resp. rate 16, weight 162 lb (73.5 kg), SpO2 99 %, unknown if currently breastfeeding. Patient Vitals for the past 24 hrs:  BP Temp Temp src Pulse Resp SpO2 Weight  12/24/17 2046 - - - - 16 - -  12/24/17 2045 (!) 146/93 - - 78 - - -  12/24/17 2030 (!) 149/93 - - 78 - - -  12/24/17 2015 (!) 148/98 - - 82 - - -  12/24/17 2000 (!) 134/92 - - 87 - - -  12/24/17 1951 134/86 - - (!) 102 - - -  12/24/17 1915 (!) 148/103 98.2 F (36.8 C) Oral 89 18 99 % 162 lb (73.5 kg)   Physical Exam  Nursing note and vitals reviewed. Constitutional: She is oriented to person, place, and time. She appears well-developed and well-nourished. No distress.  HENT:  Head: Normocephalic and atraumatic.  Neck: Normal range of motion.  Cardiovascular: Normal rate.  Respiratory: Effort normal. No respiratory distress.  GI: Soft. She exhibits no distension and no mass. There is no tenderness.  There is no rebound and no guarding.  dsg c/d/i to umbilicus  Musculoskeletal: Normal range of motion. She exhibits edema (1+ BLE).       Cervical back: Normal. She exhibits no tenderness.       Thoracic back: Normal. She exhibits no tenderness.       Lumbar back: Normal. She exhibits no bony tenderness.  Neurological: She is alert and oriented to person, place, and time.  Skin: Skin is warm and dry.  Psychiatric: She has a normal mood and affect.   Results for orders placed or performed during the hospital encounter of 12/24/17 (from the past 24 hour(s))  Urinalysis, Routine w reflex microscopic     Status: Abnormal   Collection Time: 12/24/17  7:10 PM  Result Value Ref Range   Color, Urine YELLOW YELLOW   APPearance HAZY (A) CLEAR   Specific Gravity, Urine 1.021 1.005 - 1.030   pH 6.0 5.0 - 8.0    Glucose, UA NEGATIVE NEGATIVE mg/dL   Hgb urine dipstick LARGE (A) NEGATIVE   Bilirubin Urine NEGATIVE NEGATIVE   Ketones, ur NEGATIVE NEGATIVE mg/dL   Protein, ur 30 (A) NEGATIVE mg/dL   Nitrite NEGATIVE NEGATIVE   Leukocytes, UA SMALL (A) NEGATIVE   RBC / HPF TOO NUMEROUS TO COUNT 0 - 5 RBC/hpf   WBC, UA 6-30 0 - 5 WBC/hpf   Bacteria, UA RARE (A) NONE SEEN   Squamous Epithelial / LPF 0-5 (A) NONE SEEN   Mucus PRESENT   Protein / creatinine ratio, urine     Status: None   Collection Time: 12/24/17  7:10 PM  Result Value Ref Range   Creatinine, Urine 194.00 mg/dL   Total Protein, Urine 20 mg/dL   Protein Creatinine Ratio 0.10 0.00 - 0.15 mg/mg[Cre]  Comprehensive metabolic panel     Status: Abnormal   Collection Time: 12/24/17  7:57 PM  Result Value Ref Range   Sodium 137 135 - 145 mmol/L   Potassium 3.7 3.5 - 5.1 mmol/L   Chloride 104 101 - 111 mmol/L   CO2 25 22 - 32 mmol/L   Glucose, Bld 85 65 - 99 mg/dL   BUN 8 6 - 20 mg/dL   Creatinine, Ser 4.540.58 0.44 - 1.00 mg/dL   Calcium 8.3 (L) 8.9 - 10.3 mg/dL   Total Protein 6.6 6.5 - 8.1 g/dL   Albumin 2.7 (L) 3.5 - 5.0 g/dL   AST 37 15 - 41 U/L   ALT 30 14 - 54 U/L   Alkaline Phosphatase 111 38 - 126 U/L   Total Bilirubin 0.2 (L) 0.3 - 1.2 mg/dL   GFR calc non Af Amer >60 >60 mL/min   GFR calc Af Amer >60 >60 mL/min   Anion gap 8 5 - 15  CBC     Status: Abnormal   Collection Time: 12/24/17  7:57 PM  Result Value Ref Range   WBC 5.6 4.0 - 10.5 K/uL   RBC 3.13 (L) 3.87 - 5.11 MIL/uL   Hemoglobin 7.3 (L) 12.0 - 15.0 g/dL   HCT 09.822.6 (L) 11.936.0 - 14.746.0 %   MCV 72.2 (L) 78.0 - 100.0 fL   MCH 23.3 (L) 26.0 - 34.0 pg   MCHC 32.3 30.0 - 36.0 g/dL   RDW 82.915.7 (H) 56.211.5 - 13.015.5 %   Platelets 264 150 - 400 K/uL   MAU Course  Procedures Flexeril Tylenol  MDM Labs ordered and reviewed. HA resolved. Back spasm still present. Leg edema nml, discussed normal progression  in the pp period. No severe range BPs but consistently elevated,  will start Norvasc 5mg . Will treat anemia-encouraged iron rich foods. Stable for discharge home.  Assessment and Plan   1. Postpartum state   2. Musculoskeletal back pain   3. Gestational hypertension without significant proteinuria, postpartum   4. Anemia, unspecified type    Discharge home Rx Fe Rx Norvasc Rx Colace [facility] 6 weeks Baby love for home visit this wk-BP check Return precautions  Allergies as of 12/24/2017   No Known Allergies     Medication List    TAKE these medications   amLODipine 5 MG tablet Commonly known as:  NORVASC Take 1 tablet (5 mg total) by mouth daily.   cyclobenzaprine 10 MG tablet Commonly known as:  FLEXERIL Take 1 tablet (10 mg total) by mouth 3 (three) times daily as needed for muscle spasms.   docusate sodium 100 MG capsule Commonly known as:  COLACE Take 1 capsule (100 mg total) by mouth daily as needed.   ibuprofen 800 MG tablet Commonly known as:  ADVIL,MOTRIN Take 1 tablet (800 mg total) by mouth every 8 (eight) hours as needed.   iron polysaccharides 150 MG capsule Commonly known as:  NIFEREX Take 1 capsule (150 mg total) by mouth daily.   Oxycodone HCl 10 MG Tabs Take 1 tablet (10 mg total) by mouth every 4 (four) hours as needed (pain scale > 7).   PRENATAL VITAMIN PO Take by mouth.      Donette Larry, CNM 12/24/2017, 9:02 PM

## 2017-12-24 NOTE — Discharge Instructions (Signed)
Postpartum Hypertension °Postpartum hypertension is high blood pressure after pregnancy that remains higher than normal for more than two days after delivery. You may not realize that you have postpartum hypertension if your blood pressure is not being checked regularly. In some cases, postpartum hypertension will go away on its own, usually within a week of delivery. However, for some women, medical treatment is required to prevent serious complications, such as seizures or stroke. °The following things can affect your blood pressure: °· The type of delivery you had. °· Having received IV fluids or other medicines during or after delivery. ° °What are the causes? °Postpartum hypertension may be caused by any of the following or by a combination of any of the following: °· Hypertension that existed before pregnancy (chronic hypertension). °· Gestational hypertension. °· Preeclampsia or eclampsia. °· Receiving a lot of fluid through an IV during or after delivery. °· Medicines. °· HELLP syndrome. °· Hyperthyroidism. °· Stroke. °· Other rare neurological or blood disorders. ° °In some cases, the cause may not be known. °What increases the risk? °Postpartum hypertension can be related to one or more risk factors, such as: °· Chronic hypertension. In some cases, this may not have been diagnosed before pregnancy. °· Obesity. °· Type 2 diabetes. °· Kidney disease. °· Family history of preeclampsia. °· Other medical conditions that cause hormonal imbalances. ° °What are the signs or symptoms? °As with all types of hypertension, postpartum hypertension may not have any symptoms. Depending on how high your blood pressure is, you may experience: °· Headaches. These may be mild, moderate, or severe. They may also be steady, constant, or sudden in onset (thunderclap headache). °· Visual changes. °· Dizziness. °· Shortness of breath. °· Swelling of your hands, feet, lower legs, or face. In some cases, you may have swelling in  more than one of these locations. °· Heart palpitations or a racing heartbeat. °· Difficulty breathing while lying down. °· Decreased urination. ° °Other rare signs and symptoms may include: °· Sweating more than usual. This lasts longer than a few days after delivery. °· Chest pain. °· Sudden dizziness when you get up from sitting or lying down. °· Seizures. °· Nausea or vomiting. °· Abdominal pain. ° °How is this diagnosed? °The diagnosis of postpartum hypertension is made through a combination of physical examination findings and testing of your blood and urine. You may also have additional tests, such as a CT scan or an MRI, to check for other complications of postpartum hypertension. °How is this treated? °When blood pressure is high enough to require treatment, your options may include: °· Medicines to reduce blood pressure (antihypertensives). Tell your health care provider if you are breastfeeding or if you plan to breastfeed. There are many antihypertensive medicines that are safe to take while breastfeeding. °· Stopping medicines that may be causing hypertension. °· Treating medical conditions that are causing hypertension. °· Treating the complications of hypertension, such as seizures, stroke, or kidney problems. ° °Your health care provider will also continue to monitor your blood pressure closely and repeatedly until it is within a safe range for you. °Follow these instructions at home: °· Take medicines only as directed by your health care provider. °· Get regular exercise after your health care provider tells you that it is safe. °· Follow your health care provider’s recommendations on fluid and salt restrictions. °· Do not use any tobacco products, including cigarettes, chewing tobacco, or electronic cigarettes. If you need help quitting, ask your health care provider. °·   Keep all follow-up visits as directed by your health care provider. This is important. Contact a health care provider  if:  Your symptoms get worse.  You have new symptoms, such as: ? Headache. ? Dizziness. ? Visual changes. Get help right away if:  You develop a severe or sudden headache.  You have seizures.  You develop numbness or weakness on one side of your body.  You have difficulty thinking, speaking, or swallowing.  You develop severe abdominal pain.  You develop difficulty breathing, chest pain, a racing heartbeat, or heart palpitations. These symptoms may represent a serious problem that is an emergency. Do not wait to see if the symptoms will go away. Get medical help right away. Call your local emergency services (911 in the [place] Do not drive yourself to the hospital. This information is not intended to replace advice given to you by your health care provider. Make sure you discuss any questions you have with your health care provider. Document Released: 07/10/2014 Document Revised: 04/10/2016 Document Reviewed: 05/21/2014 Elsevier Interactive Patient Education  [employer]2018 [employer] In late 2019, the [facility]. At that time, the MAU (Maternity Admissions Unit), where you are being seen today, will no longer take care of non-pregnant patients. We strongly encourage you to find a doctor's office before that time, so that you can be seen with any GYN concerns, like vaginal discharge, urinary tract infection, etc.. in a timely manner.  In order to make an office visit more convenient, the Center for [facility] at [facility] will be offering evening hours with same-day appointments, walk-in appointments and scheduled appointments available during this time.  Center for [facility] @ [facility] Hours: Monday - 8am - 7:30 pm with walk-in between 4pm- 7:30 pm Tuesday - 8 am - 5 pm (starting 02/19/18 we will be open late and accepting walk-ins from 4pm - 7:30pm) Wednesday - 8 am - 5 pm (starting 05/22/18 we will be open late and  accepting walk-ins from 4pm - 7:30pm) Thursday 8 am - 5 pm (starting 08/22/18 we will be open late and accepting walk-ins from 4pm - 7:30pm) Friday 8 am - 5 pm  For an appointment please call the Center for [facility] at [phone]  For urgent needs, [facility] is also available for management of urgent GYN complaints such as vaginal discharge or urinary tract infections.

## 2017-12-24 NOTE — MAU Note (Signed)
Pt has generalized body aches and back spasms. Had a vaginal delivery and tubal. + headache, taking oxy for the surgical pain. No BM since Thursday. No fever.

## 2017-12-25 ENCOUNTER — Telehealth: Payer: Self-pay | Admitting: General Practice

## 2017-12-25 NOTE — Telephone Encounter (Signed)
-----   Message from Marylene LandKathryn Lorraine Kooistra, CNM sent at 12/22/2017 12:48 PM EST ----- Regarding: please schedule patient for BP check in 1 week Hello! This patient had gestational hypertension; do you mind calling her and setting up a time for her to come in around Feb 8 or 10th for a BP check nurse visit?   Thank you so much!  Luna KitchensKathryn Kooistra

## 2018-01-16 ENCOUNTER — Other Ambulatory Visit: Payer: Self-pay | Admitting: Nurse Practitioner

## 2018-01-16 ENCOUNTER — Ambulatory Visit
Admission: RE | Admit: 2018-01-16 | Discharge: 2018-01-16 | Disposition: A | Discharge status: Home / Self Care | Payer: Medicaid Other | Source: Ambulatory Visit | Attending: Nurse Practitioner | Admitting: Nurse Practitioner

## 2018-01-16 DIAGNOSIS — N6002 Solitary cyst of left breast: Secondary | ICD-10-CM

## 2018-01-16 DIAGNOSIS — N6320 Unspecified lump in the left breast, unspecified quadrant: Secondary | ICD-10-CM

## 2018-02-06 ENCOUNTER — Other Ambulatory Visit: Payer: Self-pay | Admitting: Certified Nurse Midwife

## 2018-03-27 ENCOUNTER — Other Ambulatory Visit: Payer: Self-pay | Admitting: Certified Nurse Midwife

## 2018-06-12 ENCOUNTER — Ambulatory Visit (INDEPENDENT_AMBULATORY_CARE_PROVIDER_SITE_OTHER): Payer: Medicaid Other

## 2018-06-12 ENCOUNTER — Other Ambulatory Visit: Payer: Self-pay

## 2018-06-12 ENCOUNTER — Encounter (HOSPITAL_COMMUNITY): Payer: Self-pay | Admitting: Emergency Medicine

## 2018-06-12 ENCOUNTER — Ambulatory Visit (HOSPITAL_COMMUNITY)
Admission: EM | Admit: 2018-06-12 | Discharge: 2018-06-12 | Disposition: A | Discharge status: Home / Self Care | Payer: Medicaid Other | Attending: Family Medicine | Admitting: Family Medicine

## 2018-06-12 DIAGNOSIS — R0781 Pleurodynia: Secondary | ICD-10-CM

## 2018-06-12 MED ORDER — HYDROCODONE-ACETAMINOPHEN 5-325 MG PO TABS: 1.0000 | ORAL_TABLET | Freq: Four times a day (QID) | ORAL | 0 refills | Status: DC | PRN

## 2018-06-12 MED ORDER — DICLOFENAC SODIUM 75 MG PO TBEC: 75.0000 mg | DELAYED_RELEASE_TABLET | Freq: Two times a day (BID) | ORAL | 0 refills | Status: DC

## 2018-06-12 NOTE — Discharge Instructions (Signed)
Be aware, pain medications may cause drowsiness. Please do not drive, operate heavy machinery or make important decisions while on this medication, it can cloud your judgement.  
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
## 2018-06-14 ENCOUNTER — Telehealth (HOSPITAL_COMMUNITY): Payer: Self-pay

## 2018-06-14 MED ORDER — NAPROXEN 375 MG PO TABS: 375.0000 mg | ORAL_TABLET | Freq: Two times a day (BID) | ORAL | 0 refills | Status: AC
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
## 2018-07-17 ENCOUNTER — Ambulatory Visit
Admission: RE | Admit: 2018-07-17 | Discharge: 2018-07-17 | Disposition: A | Discharge status: Home / Self Care | Payer: Medicaid Other | Source: Ambulatory Visit | Attending: Nurse Practitioner | Admitting: Nurse Practitioner

## 2018-07-17 DIAGNOSIS — N6002 Solitary cyst of left breast: Secondary | ICD-10-CM

## 2018-08-22 ENCOUNTER — Other Ambulatory Visit: Payer: Self-pay

## 2018-08-22 ENCOUNTER — Ambulatory Visit (INDEPENDENT_AMBULATORY_CARE_PROVIDER_SITE_OTHER): Payer: Medicaid Other | Admitting: Physician Assistant

## 2018-08-22 ENCOUNTER — Encounter (INDEPENDENT_AMBULATORY_CARE_PROVIDER_SITE_OTHER): Payer: Self-pay | Admitting: Physician Assistant

## 2018-08-22 VITALS — BP 152/98 | HR 88 | Temp 97.8°F | Ht 63.0 in | Wt 154.2 lb

## 2018-08-22 DIAGNOSIS — I10 Essential (primary) hypertension: Secondary | ICD-10-CM | POA: Diagnosis not present

## 2018-08-22 DIAGNOSIS — Z23 Encounter for immunization: Secondary | ICD-10-CM

## 2018-08-22 DIAGNOSIS — R519 Headache, unspecified: Secondary | ICD-10-CM

## 2018-08-22 DIAGNOSIS — R51 Headache: Secondary | ICD-10-CM

## 2018-08-22 MED ORDER — AMLODIPINE BESYLATE 5 MG PO TABS: 5.0000 mg | ORAL_TABLET | Freq: Every day | ORAL | 1 refills | Status: DC

## 2018-08-22 MED ORDER — IBUPROFEN 800 MG PO TABS: 800.0000 mg | ORAL_TABLET | Freq: Three times a day (TID) | ORAL | 0 refills | Status: DC | PRN

## 2018-08-22 MED ORDER — HYDROCHLOROTHIAZIDE 12.5 MG PO TABS: 12.5000 mg | ORAL_TABLET | Freq: Every day | ORAL | 1 refills | Status: DC

## 2018-08-22 NOTE — Progress Notes (Signed)
Subjective:  Patient ID: Morgan Lee, female    DOB: 1981/06/30  Age: 37 y.o. MRN: [id number]  CC: HTN  HPI Morgan Lee is a 37 y.o. female with a medical history of depression, headaches, fatigue, palpitations, tobacco use, and syncope presents as a new patient to address her blood pressure. Says she had high blood pressure during her pregnancy. Gave birth 12/21/17. BP has been elevated since. Also has family history of hypertension. Current symptoms include left sided frontal and temporal headache since this morning. There is associated photophobia and phonophobia. Has previously taken Ibuprofen with resolution of symptoms. Does not endorse CP, palpitations, SOB, tingling, numbness, weakness, abdominal pain, f/c/n/v, rash, swelling, or GI/GU sxs. Confirms she is not breastfeeding.        Outpatient Medications Prior to Visit  Medication Sig Dispense Refill  . ibuprofen (ADVIL,MOTRIN) 800 MG tablet Take 1 tablet (800 mg total) by mouth every 8 (eight) hours as needed. (Patient not taking: Reported on 08/22/2018) 60 tablet 1  . HYDROcodone-acetaminophen (NORCO/VICODIN) 5-325 MG tablet Take 1 tablet by mouth every 6 (six) hours as needed for moderate pain or severe pain. 15 tablet 0   No facility-administered medications prior to visit.      ROS Review of Systems  Constitutional: Negative for chills, fever and malaise/fatigue.  Eyes: Negative for blurred vision.  Respiratory: Negative for shortness of breath.   Cardiovascular: Negative for chest pain and palpitations.  Gastrointestinal: Negative for abdominal pain and nausea.  Genitourinary: Negative for dysuria and hematuria.  Musculoskeletal: Negative for joint pain and myalgias.  Skin: Negative for rash.  Neurological: Positive for headaches. Negative for tingling.  Psychiatric/Behavioral: Negative for depression. The patient is not nervous/anxious.     Objective:  BP (!) 152/98 (BP Location: Left Arm, Patient Position:  Sitting, Cuff Size: Normal)   Pulse 88   Temp 97.8 F (36.6 C) (Oral)   Ht 5\' 3"  (1.6 m)   Wt 154 lb 3.2 oz (69.9 kg)   LMP 08/19/2018 (Exact Date)   SpO2 98%   Breastfeeding? No   BMI 27.32 kg/m   BP/Weight 08/22/2018 06/12/2018 12/24/2017  Systolic BP 152 142 146  Diastolic BP 98 93 93  Wt. (Lbs) 154.2 - 162  BMI 27.32 - 28.7      Physical Exam  Constitutional: She is oriented to person, place, and time.  Well developed, well nourished, NAD, polite  HENT:  Head: Normocephalic and atraumatic.  Eyes: No scleral icterus.  Neck: Normal range of motion. Neck supple. No thyromegaly present.  Cardiovascular: Normal rate, regular rhythm and normal heart sounds.  Pulmonary/Chest: Effort normal and breath sounds normal.  Musculoskeletal: She exhibits no edema.  Neurological: She is alert and oriented to person, place, and time. No cranial nerve deficit.  Skin: Skin is warm and dry. No rash noted. No erythema. No pallor.  Psychiatric: She has a normal mood and affect. Her behavior is normal. Thought content normal.  Vitals reviewed.    Assessment & Plan:   1. Hypertension, unspecified type - Begin hydrochlorothiazide (HYDRODIURIL) 12.5 MG tablet; Take 1 tablet (12.5 mg total) by mouth daily.  Dispense: 90 tablet; Refill: 1 - Refill amLODipine (NORVASC) 5 MG tablet; Take 1 tablet (5 mg total) by mouth daily.  Dispense: 90 tablet; Refill: 1 - Comprehensive metabolic panel - TSH - CBC with Differential - Lipid panel - Pt smokes cigarettes during social activities. She was advised to avoid smoking altogether to avoid health complications. She said she  will try.   2. Need for Tdap vaccination - Tdap vaccine greater than or equal to 7yo IM  3. Nonintractable headache, unspecified chronicity pattern, unspecified headache type - Begin ibuprofen (ADVIL,MOTRIN) 800 MG tablet; Take 1 tablet (800 mg total) by mouth every 8 (eight) hours as needed.  Dispense: 21 tablet; Refill: 0   Meds  ordered this encounter  Medications  . hydrochlorothiazide (HYDRODIURIL) 12.5 MG tablet    Sig: Take 1 tablet (12.5 mg total) by mouth daily.    Dispense:  90 tablet    Refill:  1    Order Specific Question:   Supervising Provider    Answer:   Hoy Register [[id number]]  . amLODipine (NORVASC) 5 MG tablet    Sig: Take 1 tablet (5 mg total) by mouth daily.    Dispense:  90 tablet    Refill:  1    Order Specific Question:   Supervising Provider    Answer:   Hoy Register [[id number]]  . ibuprofen (ADVIL,MOTRIN) 800 MG tablet    Sig: Take 1 tablet (800 mg total) by mouth every 8 (eight) hours as needed.    Dispense:  21 tablet    Refill:  0    Order Specific Question:   Supervising Provider    Answer:   Hoy Register [[id number]]    Follow-up: Return in about 4 weeks (around 09/19/2018) for HTN.   Loletta Specter PA

## 2018-08-22 NOTE — Patient Instructions (Signed)

## 2018-08-23 ENCOUNTER — Telehealth (INDEPENDENT_AMBULATORY_CARE_PROVIDER_SITE_OTHER): Payer: Self-pay

## 2018-08-23 ENCOUNTER — Other Ambulatory Visit (INDEPENDENT_AMBULATORY_CARE_PROVIDER_SITE_OTHER): Payer: Self-pay | Admitting: Physician Assistant

## 2018-08-23 DIAGNOSIS — R7989 Other specified abnormal findings of blood chemistry: Secondary | ICD-10-CM

## 2018-08-23 LAB — COMPREHENSIVE METABOLIC PANEL
ALBUMIN: 4.1 g/dL (ref 3.5–5.5)
ALT: 25 IU/L (ref 0–32)
AST: 25 IU/L (ref 0–40)
Albumin/Globulin Ratio: 1.1 — ABNORMAL LOW (ref 1.2–2.2)
Alkaline Phosphatase: 86 IU/L (ref 39–117)
BUN / CREAT RATIO: 14 (ref 9–23)
BUN: 8 mg/dL (ref 6–20)
Bilirubin Total: 0.2 mg/dL (ref 0.0–1.2)
CALCIUM: 9.1 mg/dL (ref 8.7–10.2)
CO2: 23 mmol/L (ref 20–29)
CREATININE: 0.59 mg/dL (ref 0.57–1.00)
Chloride: 101 mmol/L (ref 96–106)
GFR calc Af Amer: 134 mL/min/{1.73_m2} (ref >59)
GFR, EST NON AFRICAN AMERICAN: 117 mL/min/{1.73_m2} (ref >59)
GLOBULIN, TOTAL: 3.6 g/dL (ref 1.5–4.5)
GLUCOSE: 84 mg/dL (ref 65–99)
Potassium: 4.3 mmol/L (ref 3.5–5.2)
SODIUM: 136 mmol/L (ref 134–144)
Total Protein: 7.7 g/dL (ref 6.0–8.5)

## 2018-08-23 LAB — CBC WITH DIFFERENTIAL/PLATELET
Basophils Absolute: 0 10*3/uL (ref 0.0–0.2)
Basos: 0 %
EOS (ABSOLUTE): 0.3 10*3/uL (ref 0.0–0.4)
EOS: 6 %
HEMOGLOBIN: 9.5 g/dL — AB (ref 11.1–15.9)
Hematocrit: 31.6 % — ABNORMAL LOW (ref 34.0–46.6)
Immature Grans (Abs): 0 10*3/uL (ref 0.0–0.1)
Immature Granulocytes: 0 %
LYMPHS ABS: 1.3 10*3/uL (ref 0.7–3.1)
LYMPHS: 31 %
MCH: 20.3 pg — AB (ref 26.6–33.0)
MCHC: 30.1 g/dL — AB (ref 31.5–35.7)
MCV: 68 fL — ABNORMAL LOW (ref 79–97)
MONOCYTES: 12 %
Monocytes Absolute: 0.5 10*3/uL (ref 0.1–0.9)
NEUTROS ABS: 2.2 10*3/uL (ref 1.4–7.0)
Neutrophils: 51 %
Platelets: 448 10*3/uL (ref 150–450)
RBC: 4.67 x10E6/uL (ref 3.77–5.28)
RDW: 17.6 % — ABNORMAL HIGH (ref 12.3–15.4)
WBC: 4.4 10*3/uL (ref 3.4–10.8)

## 2018-08-23 LAB — LIPID PANEL
CHOLESTEROL TOTAL: 148 mg/dL (ref 100–199)
Chol/HDL Ratio: 2.2 ratio (ref 0.0–4.4)
HDL: 66 mg/dL (ref >39)
LDL CALC: 70 mg/dL (ref 0–99)
TRIGLYCERIDES: 60 mg/dL (ref 0–149)
VLDL Cholesterol Cal: 12 mg/dL (ref 5–40)

## 2018-08-23 LAB — TSH: TSH: 0.181 u[IU]/mL — AB (ref 0.450–4.500)

## 2018-08-23 NOTE — Telephone Encounter (Signed)
-----   Message from Loletta Specter, PA-C sent at 08/23/2018  9:15 AM EDT ----- Pt is still anemic but anemia is improving. There is thyroid irregularity and needs to return to have full thyroid panel done. I have ordered testing.

## 2018-08-23 NOTE — Telephone Encounter (Signed)
Patient is aware that she is still anemic but her anemia is improving. There is some irregularity with thyroid will need to return for a full thyroid panel. She will come in today for panel. Maryjean Morn, CMA

## 2018-09-20 ENCOUNTER — Ambulatory Visit (INDEPENDENT_AMBULATORY_CARE_PROVIDER_SITE_OTHER): Payer: Medicaid Other | Admitting: Physician Assistant

## 2018-11-10 ENCOUNTER — Encounter (HOSPITAL_COMMUNITY): Payer: Self-pay

## 2018-11-10 ENCOUNTER — Ambulatory Visit (HOSPITAL_COMMUNITY)
Admission: EM | Admit: 2018-11-10 | Discharge: 2018-11-10 | Disposition: A | Discharge status: Home / Self Care | Payer: Medicaid Other | Attending: Internal Medicine | Admitting: Internal Medicine

## 2018-11-10 ENCOUNTER — Other Ambulatory Visit: Payer: Self-pay

## 2018-11-10 DIAGNOSIS — I10 Essential (primary) hypertension: Secondary | ICD-10-CM

## 2018-11-10 DIAGNOSIS — R51 Headache: Secondary | ICD-10-CM | POA: Insufficient documentation

## 2018-11-10 DIAGNOSIS — R519 Headache, unspecified: Secondary | ICD-10-CM

## 2018-11-10 DIAGNOSIS — R1031 Right lower quadrant pain: Secondary | ICD-10-CM | POA: Diagnosis not present

## 2018-11-10 LAB — POCT URINALYSIS DIP (DEVICE)
Bilirubin Urine: NEGATIVE
GLUCOSE, UA: NEGATIVE mg/dL
Ketones, ur: NEGATIVE mg/dL
LEUKOCYTES UA: NEGATIVE
NITRITE: NEGATIVE
Protein, ur: NEGATIVE mg/dL
SPECIFIC GRAVITY, URINE: 1.025 (ref 1.005–1.030)
UROBILINOGEN UA: 0.2 mg/dL (ref 0.0–1.0)
pH: 5.5 (ref 5.0–8.0)

## 2018-11-10 MED ORDER — IBUPROFEN 800 MG PO TABS: 800.0000 mg | ORAL_TABLET | Freq: Three times a day (TID) | ORAL | 0 refills | Status: DC | PRN

## 2018-11-10 MED ORDER — POLYETHYLENE GLYCOL 3350 17 G PO PACK: 17.0000 g | PACK | Freq: Every day | ORAL | 0 refills | Status: DC

## 2018-11-10 NOTE — ED Provider Notes (Signed)
[facility]    CSN: [id number] Arrival date & time: 11/10/18  1307     History   Chief Complaint Chief Complaint  Patient presents with  . Hypertension  . pelvis pain    HPI Morgan Lee is a 37 y.o. female.   37 year old female comes in for multiple complaints.  Few day history of RLQ/suprapubic pain that is intermittent, lasts for a few seconds, sharp in nature. Occasionally can radiate to the back. Denies urinary symptoms such as frequency, dysuria, hematuria. Denies vaginal discharge, itching, pain. Denies nausea/vomiting. Denies fever, chills, night sweats. LMP 10/19/2018, history of tubal ligation. Last BM this morning with straining.  States check BP today that was high in the 150's. Denies chest pain, shortness of breath, palpitations, weakness, dizziness. She takes norvasc and HCTZ for her BP, but ran out, unsure when.      Past Medical History:  Diagnosis Date  . [redacted] weeks gestation of pregnancy   . Advanced maternal age in multigravida, second trimester   . Depression   . Fatigue   . 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 Palpitation   . SOB (shortness of breath)   . Syncope     Patient Active Problem List   Diagnosis Date Noted  . Gestational hypertension 12/22/2017  . Indication for care in labor or delivery 12/20/2017  . [redacted] weeks gestation of pregnancy   . Advanced maternal age in multigravida, second trimester     Past Surgical History:  Procedure Laterality Date  . CESAREAN SECTION    . HERNIA REPAIR    . TUBAL LIGATION Bilateral 12/21/2017   Procedure: POST PARTUM TUBAL LIGATION;  Surgeon: Hermina StaggersErvin, Michael L, MD;  [facility];  Service: Gynecology;  Laterality: Bilateral;    OB History    Gravida  11   Para  4   Term  4   Preterm      AB  7   Living  4     SAB  1   TAB  6   Ectopic      Multiple  0   Live Births  1            Home Medications    Prior to Admission medications     Medication Sig Start Date End Date Taking? Authorizing Provider  amLODipine (NORVASC) 5 MG tablet Take 1 tablet (5 mg total) by mouth daily. 08/22/18   Loletta SpecterGomez, Roger David, PA-C  hydrochlorothiazide (HYDRODIURIL) 12.5 MG tablet Take 1 tablet (12.5 mg total) by mouth daily. 08/22/18   Loletta SpecterGomez, Roger David, PA-C  ibuprofen (ADVIL,MOTRIN) 800 MG tablet Take 1 tablet (800 mg total) by mouth every 8 (eight) hours as needed. 11/10/18   Cathie HoopsYu,  V, PA-C  polyethylene glycol (MIRALAX) packet Take 17 g by mouth daily. 11/10/18   Belinda FisherYu,  V, PA-C    Family History Family History  Problem Relation Age of Onset  . Hypertension Mother   . Cancer - Lung Mother   . Hypertension Sister   . Diabetes Sister   . Seizures Sister   . Thyroid disease Sister   . Heart disease Sister        stents  . Asthma Sister   . Hypertension Brother     Social History Social History   Tobacco Use  . Smoking status: Former Games developermoker  . Smokeless tobacco: Never Used  Substance Use Topics  . Alcohol use: Yes    Comment:  occ./none with preg; stopped when preg  . Drug use: Yes    Types: Marijuana    Comment: daily; stopped once found out preg     Allergies   Patient has no known allergies.   Review of Systems Review of Systems  Reason unable to perform ROS: See HPI as above.     Physical Exam Triage Vital Signs ED Triage Vitals  Enc Vitals Group     BP 11/10/18 1457 (!) 140/94     Pulse --      Resp 11/10/18 1457 18     Temp 11/10/18 1457 98.2 F (36.8 C)     Temp Source 11/10/18 1457 Oral     SpO2 11/10/18 1457 100 %     Weight 11/10/18 1500 150 lb (68 kg)     Height --      Head Circumference --      Peak Flow --      Pain Score 11/10/18 1459 8     Pain Loc --      Pain Edu? --      Excl. in GC? --    No data found.  Updated Vital Signs BP (!) 140/94 (BP Location: Right Arm)   Temp 98.2 F (36.8 C) (Oral)   Resp 18   Wt 150 lb (68 kg)   LMP 10/19/2018   SpO2 100%   BMI 26.57 kg/m    Physical Exam Constitutional:      General: She is not in acute distress.    Appearance: She is well-developed.  HENT:     Head: Normocephalic and atraumatic.  Eyes:     Conjunctiva/sclera: Conjunctivae normal.     Pupils: Pupils are equal, round, and reactive to light.  Cardiovascular:     Rate and Rhythm: Normal rate and regular rhythm.     Heart sounds: Normal heart sounds. No murmur. No friction rub. No gallop.   Pulmonary:     Effort: Pulmonary effort is normal. No respiratory distress.     Breath sounds: Normal breath sounds. No stridor. No wheezing, rhonchi or rales.  Abdominal:     General: A surgical scar is present. Bowel sounds are normal. There is no distension.     Palpations: Abdomen is soft.     Comments: Mild tenderness to palpation of right suprapubic area. No rebound or guarding. No tenderness to Mcburney's point. Negative Rovsing's, obturator, psoas sign. No CVA tenderness.   Skin:    General: Skin is warm and dry.  Neurological:     Mental Status: She is alert and oriented to person, place, and time.  Psychiatric:        Behavior: Behavior normal.        Judgment: Judgment normal.      UC Treatments / Results  Labs (all labs ordered are listed, but only abnormal results are displayed) Labs Reviewed  POCT URINALYSIS DIP (DEVICE) - Abnormal; Notable for the following components:      Result Value   Hgb urine dipstick TRACE (*)    All other components within normal limits    EKG None  Radiology No results found.  Procedures Procedures (including critical care time)  Medications Ordered in UC Medications - No data to display  Initial Impression / Assessment and Plan / UC Course  I have reviewed the triage vital signs and the nursing notes.  Pertinent labs & imaging results that were available during my care of the patient were reviewed by me and considered in  my medical decision making (see chart for details).    Discussed urine results  with patient. Discussed possibility of urethral stone causing symptoms. Can also try miralax to see if constipation is contributing symptoms. Push fluids. Return precautions given.  Patient to restart BP medicine, patient still with available refills from PCP. Return precautions given.  Final Clinical Impressions(s) / UC Diagnoses   Final diagnoses:  RLQ abdominal pain    ED Prescriptions    Medication Sig Dispense Auth. Provider   polyethylene glycol (MIRALAX) packet Take 17 g by mouth daily. 14 each ,  V, PA-C   ibuprofen (ADVIL,MOTRIN) 800 MG tablet Take 1 tablet (800 mg total) by mouth every 8 (eight) hours as needed. 30 tablet Threasa Alpha, [place] 11/10/18 1733

## 2018-11-10 NOTE — Discharge Instructions (Signed)
Your urine had a little blood in it, with your symptoms, could be a small kidney stone that is passing. Keep hydrated, urine should be clear to pale yellow in color. As discussed, can also take miralax to see if constipation is contributing to the symptoms. Monitor for worsening symptoms, severe abdominal pain, nausea/vomiting, fever, unwilling to jump up and down due to pain, go to the emergency department for further evaluation.  You still have refills for your blood pressure medicine. Refill and start medicines as directed. Follow up with PCP for further evaluation needed.

## 2018-11-10 NOTE — ED Triage Notes (Signed)
Pt cc her b/p has been going up and she has pelvis pain. X 3 days

## 2018-11-18 ENCOUNTER — Emergency Department (HOSPITAL_COMMUNITY): Payer: Medicaid Other

## 2018-11-18 ENCOUNTER — Encounter (HOSPITAL_COMMUNITY): Payer: Self-pay | Admitting: Emergency Medicine

## 2018-11-18 ENCOUNTER — Other Ambulatory Visit: Payer: Self-pay

## 2018-11-18 ENCOUNTER — Emergency Department (HOSPITAL_COMMUNITY)
Admission: EM | Admit: 2018-11-18 | Discharge: 2018-11-18 | Disposition: A | Discharge status: Home / Self Care | Payer: Medicaid Other | Attending: Emergency Medicine | Admitting: Emergency Medicine

## 2018-11-18 DIAGNOSIS — R69 Illness, unspecified: Secondary | ICD-10-CM

## 2018-11-18 DIAGNOSIS — J111 Influenza due to unidentified influenza virus with other respiratory manifestations: Secondary | ICD-10-CM | POA: Diagnosis not present

## 2018-11-18 DIAGNOSIS — Z87891 Personal history of nicotine dependence: Secondary | ICD-10-CM | POA: Insufficient documentation

## 2018-11-18 DIAGNOSIS — Z79899 Other long term (current) drug therapy: Secondary | ICD-10-CM | POA: Insufficient documentation

## 2018-11-18 DIAGNOSIS — R05 Cough: Secondary | ICD-10-CM | POA: Diagnosis present

## 2018-11-18 MED ORDER — ACETAMINOPHEN 325 MG PO TABS
650.0000 mg | ORAL_TABLET | Freq: Once | ORAL | Status: AC
  Administered 2018-11-18: 650 mg via ORAL
  Filled 2018-11-18: qty 2

## 2018-11-18 MED ORDER — DEXAMETHASONE 4 MG PO TABS
10.0000 mg | ORAL_TABLET | Freq: Once | ORAL | Status: AC
  Administered 2018-11-18: 10 mg via ORAL
  Filled 2018-11-18: qty 3

## 2018-11-18 MED ORDER — KETOROLAC TROMETHAMINE 15 MG/ML IJ SOLN
15.0000 mg | Freq: Once | INTRAMUSCULAR | Status: AC
  Administered 2018-11-18: 15 mg via INTRAMUSCULAR
  Filled 2018-11-18: qty 1

## 2018-11-18 MED ORDER — KETOROLAC TROMETHAMINE 15 MG/ML IJ SOLN: 15.0000 mg | Freq: Once | INTRAMUSCULAR | Status: DC

## 2018-11-18 NOTE — ED Notes (Signed)
Patient transported to X-ray 

## 2018-11-18 NOTE — ED Notes (Signed)
Patient verbalizes understanding of discharge instructions. Opportunity for questioning and answers were provided. Armband removed by staff, pt discharged from ED.  

## 2018-11-18 NOTE — ED Provider Notes (Signed)
[facility] EMERGENCY DEPARTMENT Provider Note   CSN: [id number] Arrival date & time: 11/18/18  1623     History   Chief Complaint Chief Complaint  Patient presents with  . Generalized Body Aches    HPI Morgan Lee is a 37 y.o. female.  The history is provided by the patient.  URI   This is a new problem. The current episode started 2 days ago. The problem has not changed since onset.The maximum temperature recorded prior to her arrival was 100 to 100.9 F. The fever has been present for less than 1 day. Associated symptoms include congestion, sinus pain and cough. Pertinent negatives include no chest pain, no abdominal pain, no nausea, no vomiting, no dysuria, no ear pain, no sore throat and no rash. She has tried nothing for the symptoms. The treatment provided no relief.    Past Medical History:  Diagnosis Date  . [redacted] weeks gestation of pregnancy   . Advanced maternal age in multigravida, second trimester   . Depression   . Fatigue   . 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 Palpitation   . SOB (shortness of breath)   . Syncope     Patient Active Problem List   Diagnosis Date Noted  . Gestational hypertension 12/22/2017  . Indication for care in labor or delivery 12/20/2017  . [redacted] weeks gestation of pregnancy   . Advanced maternal age in multigravida, second trimester     Past Surgical History:  Procedure Laterality Date  . CESAREAN SECTION    . HERNIA REPAIR    . TUBAL LIGATION Bilateral 12/21/2017   Procedure: POST PARTUM TUBAL LIGATION;  Surgeon: Hermina Staggers, MD;  Location: [facility] BIRTHING SUITES;  Service: Gynecology;  Laterality: Bilateral;     OB History    Gravida  11   Para  4   Term  4   Preterm      AB  7   Living  4     SAB  1   TAB  6   Ectopic      Multiple  0   Live Births  1            Home Medications    Prior to Admission medications   Medication Sig Start Date End Date Taking? Authorizing  Provider  amLODipine (NORVASC) 5 MG tablet Take 1 tablet (5 mg total) by mouth daily. 08/22/18   Loletta Specter, PA-C  hydrochlorothiazide (HYDRODIURIL) 12.5 MG tablet Take 1 tablet (12.5 mg total) by mouth daily. 08/22/18   Loletta Specter, PA-C  ibuprofen (ADVIL,MOTRIN) 800 MG tablet Take 1 tablet (800 mg total) by mouth every 8 (eight) hours as needed. 11/10/18   Cathie Hoops, Amy V, PA-C  polyethylene glycol (MIRALAX) packet Take 17 g by mouth daily. 11/10/18   Belinda Fisher, PA-C    Family History Family History  Problem Relation Age of Onset  . Hypertension Mother   . Cancer - Lung Mother   . Hypertension Sister   . Diabetes Sister   . Seizures Sister   . Thyroid disease Sister   . Heart disease Sister        stents  . Asthma Sister   . Hypertension Brother     Social History Social History   Tobacco Use  . Smoking status: Former Games developer  . Smokeless tobacco: Never Used  Substance Use Topics  . Alcohol use: Yes    Comment: occ./none with preg;  stopped when preg  . Drug use: Yes    Types: Marijuana    Comment: daily; stopped once found out preg     Allergies   Patient has no known allergies.   Review of Systems Review of Systems  Constitutional: Negative for chills and fever.  HENT: Positive for congestion and sinus pain. Negative for ear pain and sore throat.   Eyes: Negative for pain and visual disturbance.  Respiratory: Positive for cough. Negative for shortness of breath.   Cardiovascular: Negative for chest pain and palpitations.  Gastrointestinal: Negative for abdominal pain, nausea and vomiting.  Genitourinary: Negative for dysuria and hematuria.  Musculoskeletal: Negative for arthralgias and back pain.  Skin: Negative for color change and rash.  Neurological: Negative for seizures and syncope.  All other systems reviewed and are negative.    Physical Exam Updated Vital Signs  ED Triage Vitals  Enc Vitals Group     BP 11/18/18 1701 (!) 113/92     Pulse  Rate 11/18/18 1701 (!) 103     Resp 11/18/18 1701 20     Temp 11/18/18 1701 99.4 F (37.4 C)     Temp Source 11/18/18 1701 Oral     SpO2 11/18/18 1701 100 %     Weight 11/18/18 1728 150 lb (68 kg)     Height 11/18/18 1728 5\' 3"  (1.6 m)     Head Circumference --      Peak Flow --      Pain Score 11/18/18 1728 10     Pain Loc --      Pain Edu? --      Excl. in GC? --     Physical Exam Vitals signs and nursing note reviewed.  Constitutional:      General: She is not in acute distress.    Appearance: She is well-developed.  HENT:     Head: Normocephalic and atraumatic.     Right Ear: Tympanic membrane normal.     Left Ear: Tympanic membrane normal.     Nose: Nose normal.     Mouth/Throat:     Pharynx: No oropharyngeal exudate.  Eyes:     Extraocular Movements: Extraocular movements intact.     Conjunctiva/sclera: Conjunctivae normal.     Pupils: Pupils are equal, round, and reactive to light.  Neck:     Musculoskeletal: Neck supple.  Cardiovascular:     Rate and Rhythm: Normal rate and regular rhythm.     Pulses: Normal pulses.     Heart sounds: Normal heart sounds. No murmur.  Pulmonary:     Effort: Pulmonary effort is normal. No respiratory distress.     Breath sounds: Normal breath sounds.  Abdominal:     General: There is no distension.     Palpations: Abdomen is soft.     Tenderness: There is no abdominal tenderness.  Skin:    General: Skin is warm and dry.     Capillary Refill: Capillary refill takes less than 2 seconds.  Neurological:     General: No focal deficit present.     Mental Status: She is alert.  Psychiatric:        Mood and Affect: Mood normal.      ED Treatments / Results  Labs (all labs ordered are listed, but only abnormal results are displayed) Labs Reviewed - No data to display  EKG None  Radiology Dg Chest 2 View  Result Date: 11/18/2018 CLINICAL DATA:  Fever, dizziness, shortness of breath EXAM: CHEST -  2 VIEW COMPARISON:   06/12/2018 FINDINGS: Heart and mediastinal contours are within normal limits. No focal opacities or effusions. No acute bony abnormality. IMPRESSION: No active cardiopulmonary disease. Electronically Signed   By: Charlett NoseKevin  Dover M.D.   On: 11/18/2018 18:33    Procedures Procedures (including critical care time)  Medications Ordered in ED Medications  dexamethasone (DECADRON) tablet 10 mg (10 mg Oral Given 11/18/18 1818)  acetaminophen (TYLENOL) tablet 650 mg (650 mg Oral Given 11/18/18 1818)  ketorolac (TORADOL) 15 MG/ML injection 15 mg (15 mg Intramuscular Given 11/18/18 1818)     Initial Impression / Assessment and Plan / ED Course  I have reviewed the triage vital signs and the nursing notes.  Pertinent labs & imaging results that were available during my care of the patient were reviewed by me and considered in my medical decision making (see chart for details).     Richardo HanksLillian M Rease is a 37 year old female with no significant medical problems who presents to the ED with flulike illness.  Patient with mild fever, mild tachycardia.  She overall is well-appearing.  No signs of your throat infection on exam.  Clear breath sounds.  Patient has not taken any Tylenol or Motrin for symptoms.  She does not appear very dehydrated.  Chest x-ray did not show any signs of pneumonia, pneumothorax, pleural effusion.  Patient did not get a flu shot this year.  Her son also has flulike symptoms.  Suspect likely viral process.  Patient was given Decadron, Tylenol, Toradol while in the ED for symptomatic care.  Recommend continued use of Tylenol Motrin for pain.  Told to increase oral hydration.  Patient was discharged in the ED in good condition given information to follow-up with primary care doctor.  Told her return to ED if symptoms worsen.  This chart was dictated using voice recognition software.  Despite best efforts to proofread,  errors can occur which can change the documentation meaning.   Final  Clinical Impressions(s) / ED Diagnoses   Final diagnoses:  Influenza-like illness    ED Discharge Orders    None       Virgina NorfolkCuratolo, Eryca Bolte, DO 11/18/18 1859

## 2018-11-18 NOTE — ED Triage Notes (Signed)
Pt arrives to ED from home with complaints of generalized body aches starting yesterday. Pt reports her back hurts the worst. Pt placed in position of comfort with bed locked and lowered, call bell in reach.

## 2018-11-18 NOTE — Discharge Instructions (Addendum)
Continue 800 mg of Motrin every 8 hours and 650 mg of Tylenol every 6 hours for body aches.  There was no sign of lung infection on chest x-ray.  No need for any antibiotics at this time.  Continue hydration.  Follow-up with your primary care doctor if you do not improve.  Return to the ED if her symptoms worsen.

## 2019-01-27 ENCOUNTER — Encounter (INDEPENDENT_AMBULATORY_CARE_PROVIDER_SITE_OTHER): Payer: Self-pay

## 2019-01-27 ENCOUNTER — Ambulatory Visit (INDEPENDENT_AMBULATORY_CARE_PROVIDER_SITE_OTHER): Payer: Medicaid Other

## 2019-01-27 VITALS — Ht 63.0 in

## 2019-01-27 DIAGNOSIS — Z111 Encounter for screening for respiratory tuberculosis: Secondary | ICD-10-CM | POA: Diagnosis not present

## 2019-01-29 ENCOUNTER — Ambulatory Visit (INDEPENDENT_AMBULATORY_CARE_PROVIDER_SITE_OTHER): Payer: Medicaid Other

## 2019-01-29 ENCOUNTER — Other Ambulatory Visit: Payer: Self-pay

## 2019-01-29 DIAGNOSIS — Z111 Encounter for screening for respiratory tuberculosis: Secondary | ICD-10-CM

## 2019-01-29 LAB — TB SKIN TEST
Induration: 0 mm
TB Skin Test: NEGATIVE

## 2019-02-05 ENCOUNTER — Ambulatory Visit (INDEPENDENT_AMBULATORY_CARE_PROVIDER_SITE_OTHER): Payer: Medicaid Other | Admitting: Primary Care

## 2019-04-22 ENCOUNTER — Other Ambulatory Visit: Payer: Self-pay | Admitting: Pharmacist

## 2019-04-22 DIAGNOSIS — I10 Essential (primary) hypertension: Secondary | ICD-10-CM

## 2019-04-22 MED ORDER — HYDROCHLOROTHIAZIDE 12.5 MG PO TABS: 12.5000 mg | ORAL_TABLET | Freq: Every day | ORAL | 0 refills | Status: DC

## 2019-04-22 MED ORDER — AMLODIPINE BESYLATE 5 MG PO TABS: 5.0000 mg | ORAL_TABLET | Freq: Every day | ORAL | 0 refills | Status: DC

## 2019-06-04 IMAGING — US US MFM OB FOLLOW-UP
1 series · 14 of 28 positions shown · non-contrast
Comparison: none

[Series 1: us mfm ob follow-up · 38 acquisitions, 14 frames shown]
[im 2/38]
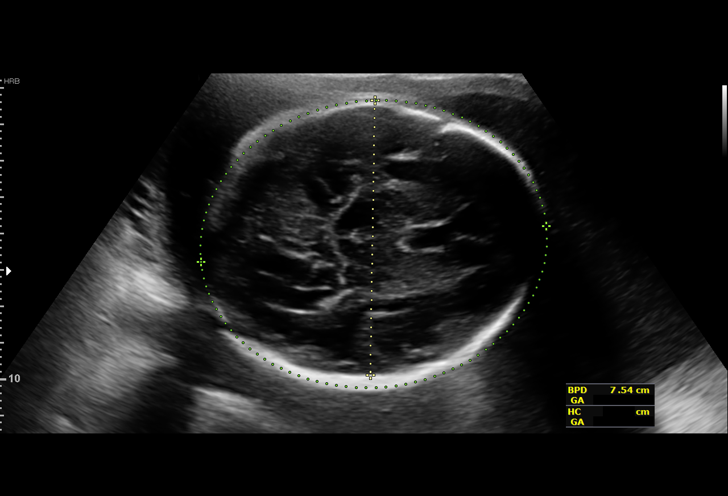
[im 5/38]
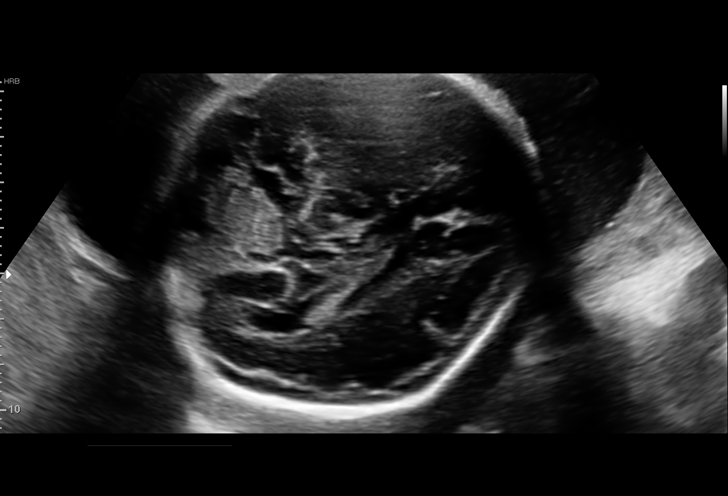
[im 7/38]
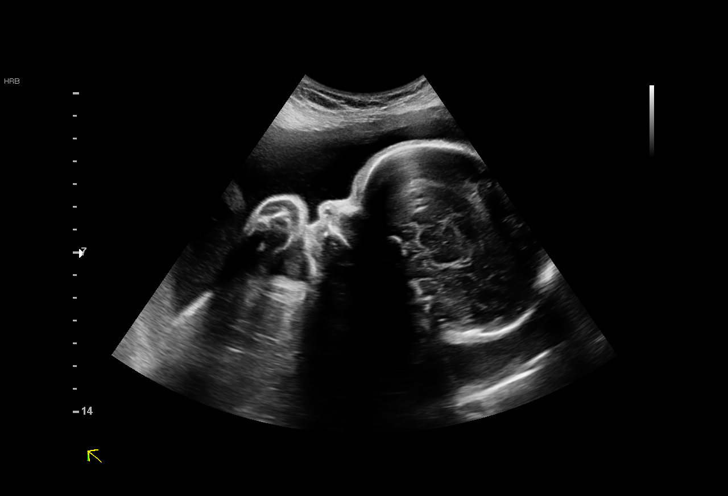
[im 10/38]
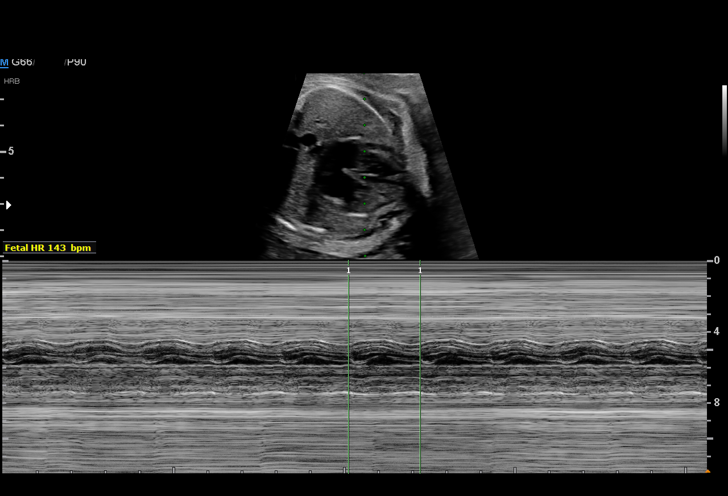
[im 13/38]
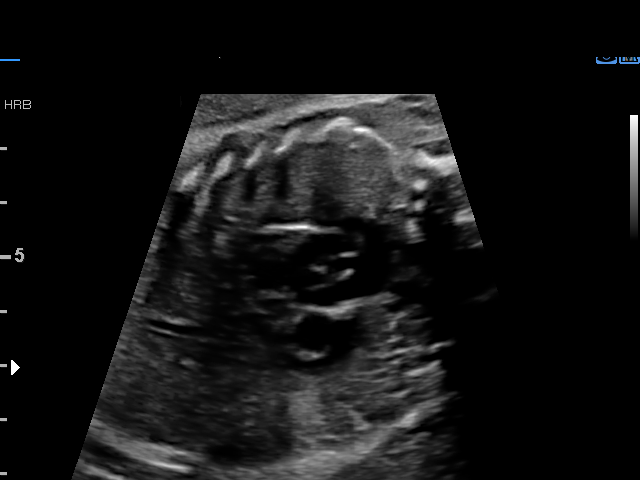
[im 16/38]
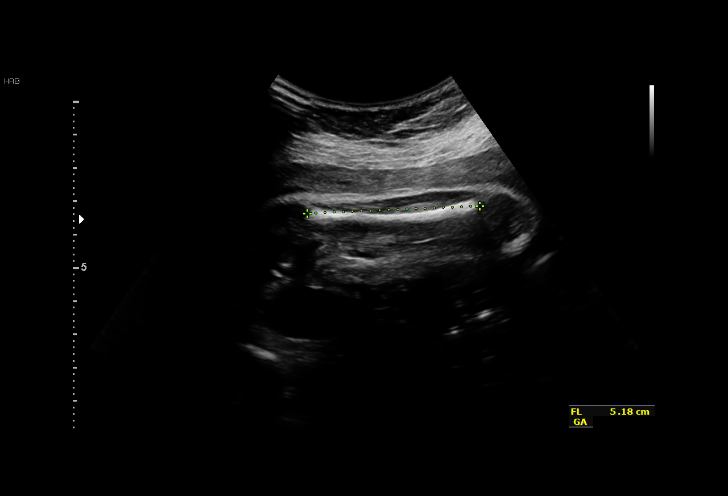
[im 18/38]
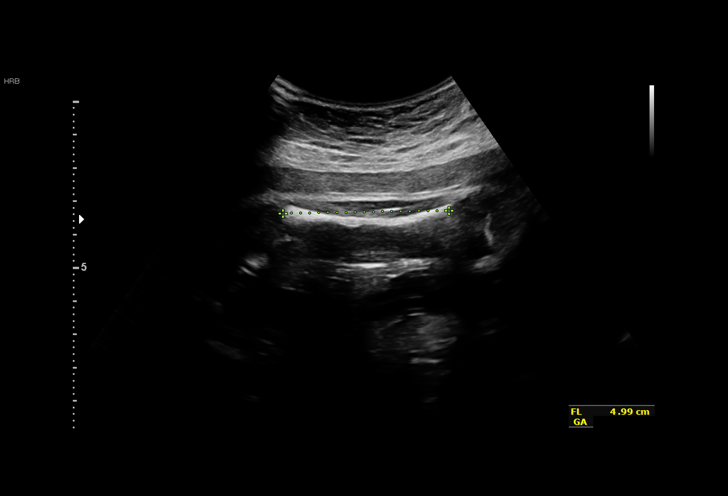
[im 21/38]
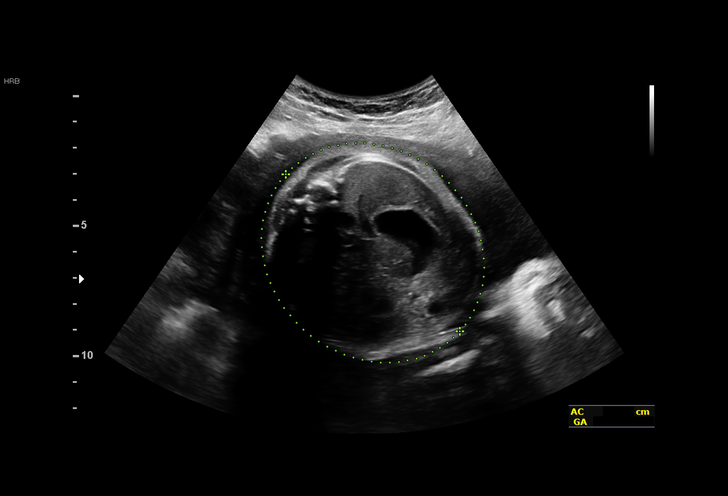
[im 24/38]
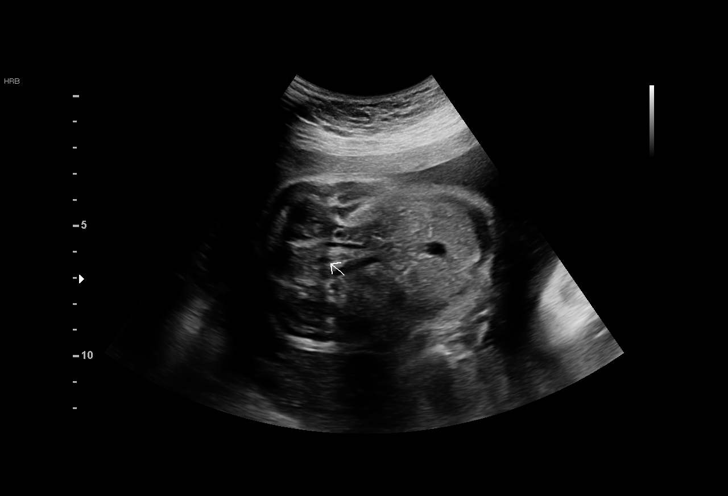
[im 27/38]
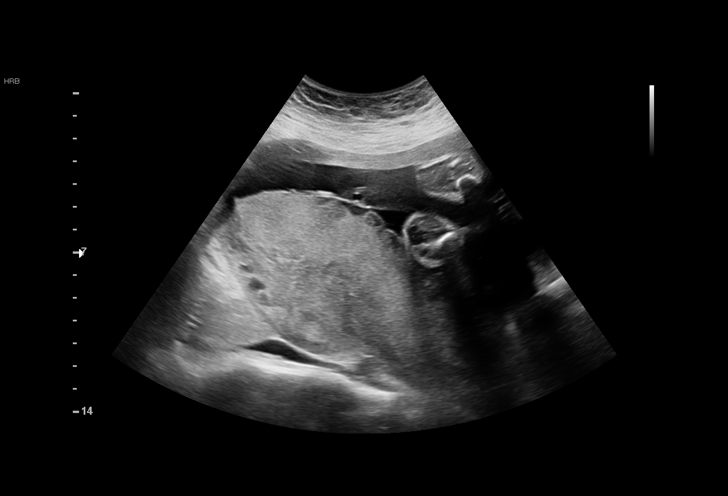
[im 29/38]
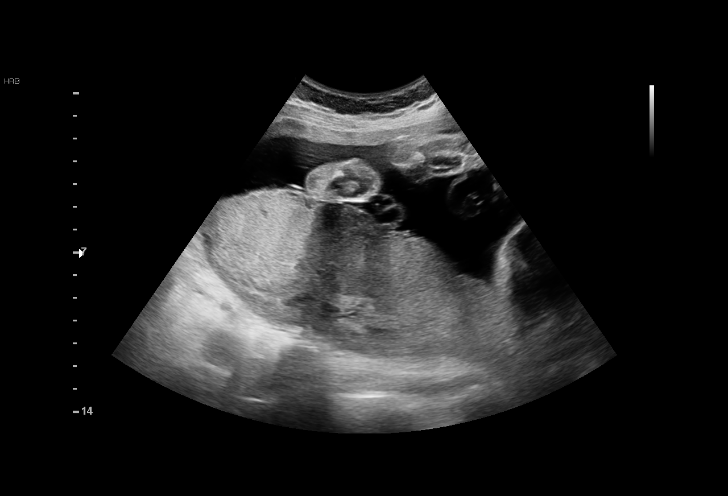
[im 32/38]
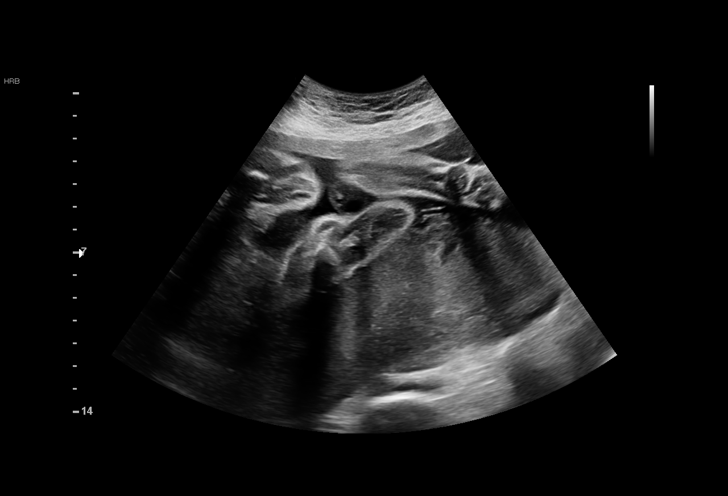
[im 35/38]
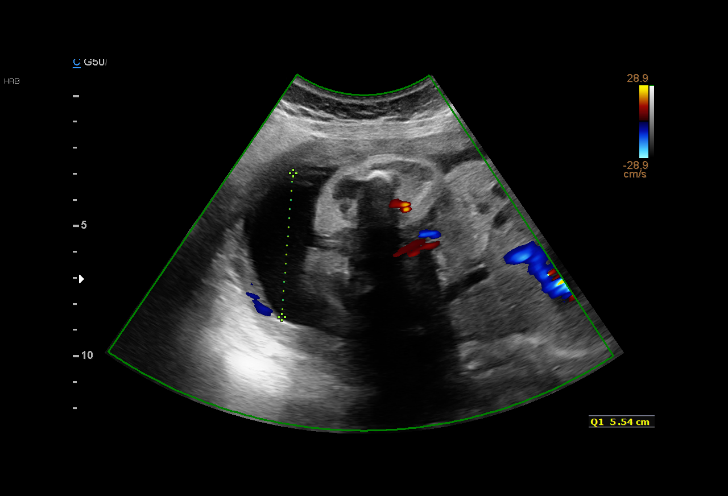
[im 38/38]
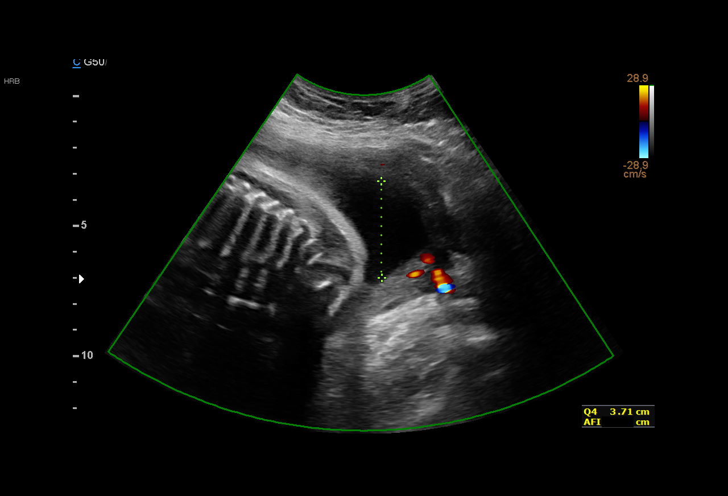

[14 of 28 positions shown; findings below may reference images not displayed]

HING KWONG NP

[address]  [address]AK            [id number]      [id number]     [id number]
Indications

28 weeks gestation of pregnancy
Abnormal biochemical screen (quad) for
Trisomy 21 ([DATE]) drawn with wrong dates
Advanced maternal age multigravida 35+
(36), second trimester; declined further
testing
History of cesarean delivery, currently
pregnant
Encounter for other antenatal screening
follow-up
OB History

Blood Type:            Height:  5'2"   Weight (lb):  160       BMI:
Gravidity:    11        Term:   3        Prem:   0        SAB:   1
TOP:          6       Ectopic:  0        Living: 3
Fetal Evaluation

Num Of Fetuses:     1
Fetal Heart         143
Rate(bpm):
Cardiac Activity:   Observed
Presentation:       Cephalic
Placenta:           Posterior, above cervical os
P. Cord Insertion:  Previously Visualized

Amniotic Fluid
AFI FV:      Subjectively within normal limits

AFI Sum(cm)     %Tile       Largest Pocket(cm)
19.78           78
RUQ(cm)       RLQ(cm)       LUQ(cm)        LLQ(cm)
5.54
Biometry

BPD:      76.1  mm     G. Age:  30w 4d         91  %    CI:        77.42   %    70 - 86
FL/HC:      18.6   %    19.6 -
HC:      273.8  mm     G. Age:  29w 6d         59  %    HC/AC:      1.03        0.99 -
AC:      265.6  mm     G. Age:  30w 5d         93  %    FL/BPD:     66.9   %    71 - 87
FL:       50.9  mm     G. Age:  27w 2d          8  %    FL/AC:      19.2   %    20 - 24
HUM:      48.5  mm     G. Age:  28w 4d         43  %

Est. FW:    3096  gm      3 lb 2 oz     69  %
Gestational Age

LMP:           30w 1d        Date:  03/04/17                 EDD:   12/09/17
U/S Today:     29w 4d                                        EDD:   12/13/17
Best:          28w 4d     Det. By:  U/S  (07/11/17)          EDD:   12/20/17
Anatomy

Cranium:               Appears normal         Aortic Arch:            Previously seen
Cavum:                 Appears normal         Ductal Arch:            Previously seen
Ventricles:            Appears normal         Diaphragm:              Appears normal
Choroid Plexus:        Previously seen        Stomach:                Appears normal, left
sided
Cerebellum:            Previously seen        Abdomen:                Appears normal
Posterior Fossa:       Previously seen        Abdominal Wall:         Previously seen
Nuchal Fold:           Previously seen        Cord Vessels:           Previously seen
Face:                  Profile nl; orbits     Kidneys:                Previously seen
prev seen
Lips:                  Appears normal         Bladder:                Appears normal
Thoracic:              Appears normal         Spine:                  Previously seen
Heart:                 Appears normal         Upper Extremities:      Previously seen
(4CH, axis, and situs
RVOT:                  Appears normal         Lower Extremities:      Previously seen
LVOT:                  Appears normal

Other:  Fetus appears to be a male.Heels visualized previously. Technically
difficult due to early gestational age.
Impression

SIUP at 28+4 weeks
Normal interval anatomy; anatomic survey complete
Normal amniotic fluid volume
Appropriate interval growth with EFW at the 69th %tile
Recommendations

Follow-up ultrasound for growth at 
 34 weeks (AMA)

## 2019-09-17 ENCOUNTER — Other Ambulatory Visit: Payer: Self-pay | Admitting: Nurse Practitioner

## 2019-09-17 ENCOUNTER — Other Ambulatory Visit: Payer: Self-pay | Admitting: Primary Care

## 2019-09-17 DIAGNOSIS — N6002 Solitary cyst of left breast: Secondary | ICD-10-CM

## 2019-09-18 ENCOUNTER — Other Ambulatory Visit: Payer: Self-pay | Admitting: Primary Care

## 2019-09-18 DIAGNOSIS — N6002 Solitary cyst of left breast: Secondary | ICD-10-CM

## 2019-09-30 ENCOUNTER — Other Ambulatory Visit: Payer: Medicaid Other

## 2019-09-30 ENCOUNTER — Inpatient Hospital Stay: Admission: RE | Admit: 2019-09-30 | Payer: Medicaid Other | Source: Ambulatory Visit

## 2019-10-15 ENCOUNTER — Ambulatory Visit (INDEPENDENT_AMBULATORY_CARE_PROVIDER_SITE_OTHER): Payer: Medicaid Other | Admitting: Primary Care

## 2019-10-21 ENCOUNTER — Other Ambulatory Visit: Payer: Self-pay | Admitting: Family Medicine

## 2019-10-21 DIAGNOSIS — I10 Essential (primary) hypertension: Secondary | ICD-10-CM

## 2019-10-21 NOTE — Telephone Encounter (Signed)
Please refill if appropriate request 

## 2019-11-27 ENCOUNTER — Encounter (INDEPENDENT_AMBULATORY_CARE_PROVIDER_SITE_OTHER): Payer: Self-pay | Admitting: Primary Care

## 2019-11-27 ENCOUNTER — Ambulatory Visit (INDEPENDENT_AMBULATORY_CARE_PROVIDER_SITE_OTHER): Payer: Medicaid Other | Admitting: Primary Care

## 2019-11-27 ENCOUNTER — Other Ambulatory Visit: Payer: Self-pay

## 2019-11-27 VITALS — BP 125/89 | HR 95 | Temp 97.3°F | Ht 63.0 in | Wt 146.6 lb

## 2019-11-27 DIAGNOSIS — I10 Essential (primary) hypertension: Secondary | ICD-10-CM

## 2019-11-27 DIAGNOSIS — G8929 Other chronic pain: Secondary | ICD-10-CM | POA: Diagnosis not present

## 2019-11-27 DIAGNOSIS — M25512 Pain in left shoulder: Secondary | ICD-10-CM | POA: Diagnosis not present

## 2019-11-27 MED ORDER — BLOOD PRESSURE MONITOR KIT: 1.0000 | PACK | Freq: Three times a day (TID) | 0 refills | Status: DC

## 2019-11-27 MED ORDER — BLOOD PRESSURE MONITOR KIT: 1.0000 | PACK | Freq: Three times a day (TID) | 0 refills | Status: AC

## 2019-11-27 MED ORDER — HYDROCHLOROTHIAZIDE 12.5 MG PO TABS: ORAL_TABLET | ORAL | 1 refills | Status: DC

## 2019-11-27 MED ORDER — AMLODIPINE BESYLATE 5 MG PO TABS: ORAL_TABLET | ORAL | 1 refills | Status: DC

## 2019-11-27 NOTE — Patient Instructions (Signed)
Shoulder Pain Many things can cause shoulder pain, including:  An injury.  Moving the shoulder in the same way again and again (overuse).  Joint pain (arthritis). Pain can come from:  Swelling and irritation (inflammation) of any part of the shoulder.  An injury to the shoulder joint.  An injury to: ? Tissues that connect muscle to bone (tendons). ? Tissues that connect bones to each other (ligaments). ? Bones. Follow these instructions at home: Watch for changes in your symptoms. Let your doctor know about them. Follow these instructions to help with your pain. If you have a sling:  Wear the sling as told by your doctor. Remove it only as told by your doctor.  Loosen the sling if your fingers: ? Tingle. ? Become numb. ? Turn cold and blue.  Keep the sling clean.  If the sling is not waterproof: ? Do not let it get wet. ? Take the sling off when you shower or bathe. Managing pain, stiffness, and swelling   If told, put ice on the painful area: ? Put ice in a plastic bag. ? Place a towel between your skin and the bag. ? Leave the ice on for 20 minutes, 2-3 times a day. Stop putting ice on if it does not help with the pain.  Squeeze a soft ball or a foam pad as much as possible. This prevents swelling in the shoulder. It also helps to strengthen the arm. General instructions  Take over-the-counter and prescription medicines only as told by your doctor.  Keep all follow-up visits as told by your doctor. This is important. Contact a doctor if:  Your pain gets worse.  Medicine does not help your pain.  You have new pain in your arm, hand, or fingers. Get help right away if:  Your arm, hand, or fingers: ? Tingle. ? Are numb. ? Are swollen. ? Are painful. ? Turn white or blue. Summary  Shoulder pain can be caused by many things. These include injury, moving the shoulder in the same away again and again, and joint pain.  Watch for changes in your symptoms.  Let your doctor know about them.  This condition may be treated with a sling, ice, and pain medicine.  Contact your doctor if the pain gets worse or you have new pain. Get help right away if your arm, hand, or fingers tingle or get numb, swollen, or painful.  Keep all follow-up visits as told by your doctor. This is important. This information is not intended to replace advice given to you by your health care provider. Make sure you discuss any questions you have with your health care provider. Document Revised: 05/21/2018 Document Reviewed: 05/21/2018 Elsevier Patient Education  2020 Elsevier Inc.  

## 2019-11-27 NOTE — Progress Notes (Signed)
Established Patient Office Visit  Subjective:  Patient ID: Morgan Lee, female    DOB: 11-03-1981  Age: 39 y.o. MRN: [id number]  CC:  Chief Complaint  Patient presents with  . Hypertension    HPI Morgan Lee presents for management of hypertension she denies shortness of breath, headaches, chest pain or lower extremity edema. She is concerned about her left shoulder pain that radiates to fingers causing numbness -no crepitus felt or heard.   Past Medical History:  Diagnosis Date  . [redacted] weeks gestation of pregnancy   . Advanced maternal age in multigravida, second trimester   . Depression   . Fatigue   . 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 Palpitation   . SOB (shortness of breath)   . Syncope     Past Surgical History:  Procedure Laterality Date  . CESAREAN SECTION    . HERNIA REPAIR    . TUBAL LIGATION Bilateral 12/21/2017   Procedure: POST PARTUM TUBAL LIGATION;  Surgeon: Chancy Milroy, MD;  Location: [place];  Service: Gynecology;  Laterality: Bilateral;    Family History  Problem Relation Age of Onset  . Hypertension Mother   . Cancer - Lung Mother   . Hypertension Sister   . Diabetes Sister   . Seizures Sister   . Thyroid disease Sister   . Heart disease Sister        stents  . Asthma Sister   . Hypertension Brother     Social History   Socioeconomic History  . Marital status: Single    Spouse name: Not on file  . Number of children: Not on file  . Years of education: Not on file  . Highest education level: Not on file  Occupational History  . Not on file  Tobacco Use  . Smoking status: Former Research scientist (life sciences)  . Smokeless tobacco: Never Used  Substance and Sexual Activity  . Alcohol use: Yes    Comment: occ./none with preg; stopped when preg  . Drug use: Yes    Types: Marijuana    Comment: daily; stopped once found out preg  . Sexual activity: Yes    Birth control/protection: Surgical    Comment: BTL  Other Topics Concern  . Not  on file  Social History Narrative  . Not on file   Social Determinants of Health   Financial Resource Strain:   . Difficulty of Paying Living Expenses: Not on file  Food Insecurity:   . Worried About Charity fundraiser in the Last Year: Not on file  . Ran Out of Food in the Last Year: Not on file  Transportation Needs:   . Lack of Transportation (Medical): Not on file  . Lack of Transportation (Non-Medical): Not on file  Physical Activity:   . Days of Exercise per Week: Not on file  . Minutes of Exercise per Session: Not on file  Stress:   . Feeling of Stress : Not on file  Social Connections:   . Frequency of Communication with Friends and Family: Not on file  . Frequency of Social Gatherings with Friends and Family: Not on file  . Attends Religious Services: Not on file  . Active Member of Clubs or Organizations: Not on file  . Attends Archivist Meetings: Not on file  . Marital Status: Not on file  Intimate Partner Violence:   . Fear of Current or Ex-Partner: Not on file  . Emotionally Abused: Not on file  .  Physically Abused: Not on file  . Sexually Abused: Not on file    Outpatient Medications Prior to Visit  Medication Sig Dispense Refill  . amLODipine (NORVASC) 5 MG tablet TAKE 1 TABLET (5 MG TOTAL) BY MOUTH DAILY. 30 tablet 0  . hydrochlorothiazide (HYDRODIURIL) 12.5 MG tablet TAKE 1 TABLET (12.5 MG TOTAL) BY MOUTH DAILY. 30 tablet 0  . ibuprofen (ADVIL,MOTRIN) 800 MG tablet Take 1 tablet (800 mg total) by mouth every 8 (eight) hours as needed. 30 tablet 0  . polyethylene glycol (MIRALAX) packet Take 17 g by mouth daily. 14 each 0   No facility-administered medications prior to visit.    No Known Allergies  ROS Review of Systems  Musculoskeletal:       Shoulder pain   All other systems reviewed and are negative.     Objective:    Physical Exam  BP 125/89 (BP Location: Left Arm, Patient Position: Sitting, Cuff Size: Normal)   Pulse 95    Temp (!) 97.3 F (36.3 C) (Temporal)   Ht _0  (1.6 m)   Wt 146 lb 9.6 oz (66.5 kg)   LMP 11/21/2019 (Approximate)   SpO2 97%   Breastfeeding No   BMI 25.97 kg/m  Wt Readings from Last 3 Encounters:  11/27/19 146 lb 9.6 oz (66.5 kg)  11/18/18 150 lb (68 kg)  11/10/18 150 lb (68 kg)     Health Maintenance Due  Topic Date Due  . PAP SMEAR-Modifier  06/10/2001    There are no preventive care reminders to display for this patient.  Lab Results  Component Value Date   TSH 0.181 (L) 08/22/2018   Lab Results  Component Value Date   WBC 4.4 08/22/2018   HGB 9.5 (L) 08/22/2018   HCT 31.6 (L) 08/22/2018   MCV 68 (L) 08/22/2018   PLT 448 08/22/2018   Lab Results  Component Value Date   NA 136 08/22/2018   K 4.3 08/22/2018   CO2 23 08/22/2018   GLUCOSE 84 08/22/2018   BUN 8 08/22/2018   CREATININE 0.59 08/22/2018   BILITOT 0.2 08/22/2018   ALKPHOS 86 08/22/2018   AST 25 08/22/2018   ALT 25 08/22/2018   PROT 7.7 08/22/2018   ALBUMIN 4.1 08/22/2018   CALCIUM 9.1 08/22/2018   ANIONGAP 8 12/24/2017   Lab Results  Component Value Date   CHOL 148 08/22/2018   Lab Results  Component Value Date   HDL 66 08/22/2018   Lab Results  Component Value Date   LDLCALC 70 08/22/2018   Lab Results  Component Value Date   TRIG 60 08/22/2018   Lab Results  Component Value Date   CHOLHDL 2.2 08/22/2018   No results found for: HGBA1C    Assessment & Plan:  Kyung was seen today for hypertension.  Diagnoses and all orders for this visit:  Chronic left shoulder pain . May alternate with heat and ice application for pain relief. May also alternate with acetaminophen and Ibuprofen as prescribed pain relief. Other alternatives include massage, acupuncture and water aerobics.  You must stay active and avoid a sedentary lifestyle. AMB referral to orthopedics  Hypertension, unspecified type Counseled on blood pressure goal of less than 130/80, low-sodium, DASH diet,  medication compliance, 150 minutes of moderate intensity exercise per week. Discussed medication compliance, adverse effects. -     amLODipine (NORVASC) 5 MG tablet; TAKE 1 TABLET (5 MG TOTAL) BY MOUTH DAILY. -     hydrochlorothiazide (HYDRODIURIL) 12.5 MG tablet; TAKE  1 TABLET (12.5 MG TOTAL) BY MOUTH DAILY.  -     Other orders -     Discontinue: Blood Pressure Monitor KIT; 1 kit by Does not apply route 3 (three) times daily. -     Blood Pressure Monitor KIT; 1 kit by Does not apply route 3 (three) times daily.    Meds ordered this encounter  Medications  . DISCONTD: Blood Pressure Monitor KIT    Sig: 1 kit by Does not apply route 3 (three) times daily.    Dispense:  1 kit    Refill:  0  . amLODipine (NORVASC) 5 MG tablet    Sig: TAKE 1 TABLET (5 MG TOTAL) BY MOUTH DAILY.    Dispense:  90 tablet    Refill:  1  . hydrochlorothiazide (HYDRODIURIL) 12.5 MG tablet    Sig: TAKE 1 TABLET (12.5 MG TOTAL) BY MOUTH DAILY.    Dispense:  90 tablet    Refill:  1  . Blood Pressure Monitor KIT    Sig: 1 kit by Does not apply route 3 (three) times daily.    Dispense:  1 kit    Refill:  0    Follow-up: Return for schedule pap.    Kerin Perna, NP

## 2019-11-28 ENCOUNTER — Ambulatory Visit (INDEPENDENT_AMBULATORY_CARE_PROVIDER_SITE_OTHER): Payer: Medicaid Other | Admitting: Primary Care

## 2019-12-03 ENCOUNTER — Other Ambulatory Visit: Payer: Self-pay

## 2019-12-03 ENCOUNTER — Encounter: Payer: Self-pay | Admitting: Family Medicine

## 2019-12-03 ENCOUNTER — Ambulatory Visit (INDEPENDENT_AMBULATORY_CARE_PROVIDER_SITE_OTHER): Payer: Medicaid Other | Admitting: Family Medicine

## 2019-12-03 ENCOUNTER — Ambulatory Visit: Payer: Medicaid Other | Admitting: Family Medicine

## 2019-12-03 DIAGNOSIS — R200 Anesthesia of skin: Secondary | ICD-10-CM | POA: Diagnosis not present

## 2019-12-03 NOTE — Progress Notes (Signed)
Office Visit Note   Patient: Morgan Lee           Date of Birth: 01-08-1981           MRN: [id number] Visit Date: 12/03/2019 Requested by: Kerin Perna, NP [address] [place],  [place] [address] PCP: Kerin Perna, NP  Subjective: Chief Complaint  Patient presents with  . Left Arm - Numbness    Numbness in the arm, starting around the shoulder and going down the arm to the hand/fingers x 3-4 months. NKI. Left-hand dominant.    HPI: She is here with left arm numbness.  No injury, symptoms started about 3 or 4 months ago.  Intermittent numbness from the wrist proximally toward the shoulder.  The numbness does not involve the hand or fingers.  She denies any pain in the neck or anywhere else.  The symptoms occur a lot when she is sitting but it can happen randomly at other times.  When it occurs, if she rotates her shoulder around it seems to stop.  Denies any headaches, vision change.  She does have hypertension but it has been well controlled.  She is left-hand dominant.  She has not noticed any weakness.              ROS: No fevers or chills.  All other systems were reviewed and are negative.  Objective: Vital Signs: LMP 11/21/2019 (Approximate)   Physical Exam:  General:  Alert and oriented, in no acute distress. Pulm:  Breathing unlabored. Psy:  Normal mood, congruent affect. Skin: No rash. Left arm: She has full neck range of motion with negative Spurling's test.  5/5 upper extremity strength and 2+ DTRs.  Negative Tinel's at the ulnar groove and at the carpal tunnel, negative Phalen's test.  I am unable to reproduce her symptoms with exam maneuvers.  She has 2+ carotid pulses, no supraclavicular masses.  She does have slightly slouched posture.  Imaging: None today.  Assessment & Plan: 1.  Left arm numbness, etiology uncertain.  Question brachial plexus irritation from posture. -Discussed options with her and elected to try physical therapy.  If she  gets no relief, then nerve conduction studies.     Procedures: No procedures performed  No notes on file     PMFS History: Patient Active Problem List   Diagnosis Date Noted  . Gestational hypertension 12/22/2017  . Indication for care in labor or delivery 12/20/2017  . [redacted] weeks gestation of pregnancy   . Advanced maternal age in multigravida, second trimester   . Anemia complicating pregnancy in third trimester 06/23/2012  . Desires VBAC (vaginal birth after cesarean) trial 04/23/2012  . Prenatal care, subsequent pregnancy 04/23/2012   Past Medical History:  Diagnosis Date  . [redacted] weeks gestation of pregnancy   . Advanced maternal age in multigravida, second trimester   . Depression   . Fatigue   . 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 Palpitation   . SOB (shortness of breath)   . Syncope     Family History  Problem Relation Age of Onset  . Hypertension Mother   . Cancer - Lung Mother   . Hypertension Sister   . Diabetes Sister   . Seizures Sister   . Thyroid disease Sister   . Heart disease Sister        stents  . Asthma Sister   . Hypertension Brother     Past Surgical History:  Procedure Laterality Date  .  CESAREAN SECTION    . HERNIA REPAIR    . TUBAL LIGATION Bilateral 12/21/2017   Procedure: POST PARTUM TUBAL LIGATION;  Surgeon: Hermina Staggers, MD;  Location: [facility];  Service: Gynecology;  Laterality: Bilateral;   Social History   Occupational History  . Not on file  Tobacco Use  . Smoking status: Former Games developer  . Smokeless tobacco: Never Used  Substance and Sexual Activity  . Alcohol use: Yes    Comment: occ./none with preg; stopped when preg  . Drug use: Yes    Types: Marijuana    Comment: daily; stopped once found out preg  . Sexual activity: Yes    Birth control/protection: Surgical    Comment: BTL

## 2019-12-05 ENCOUNTER — Ambulatory Visit (INDEPENDENT_AMBULATORY_CARE_PROVIDER_SITE_OTHER): Payer: Medicaid Other | Admitting: Primary Care

## 2019-12-05 ENCOUNTER — Other Ambulatory Visit: Payer: Self-pay

## 2019-12-05 ENCOUNTER — Encounter (INDEPENDENT_AMBULATORY_CARE_PROVIDER_SITE_OTHER): Payer: Self-pay | Admitting: Primary Care

## 2019-12-05 ENCOUNTER — Other Ambulatory Visit (HOSPITAL_COMMUNITY)
Admission: RE | Admit: 2019-12-05 | Discharge: 2019-12-05 | Disposition: A | Discharge status: Home / Self Care | Payer: Medicaid Other | Source: Ambulatory Visit | Attending: Primary Care | Admitting: Primary Care

## 2019-12-05 VITALS — BP 128/90 | HR 73 | Temp 97.2°F | Ht 63.0 in | Wt 149.0 lb

## 2019-12-05 DIAGNOSIS — Z13220 Encounter for screening for lipoid disorders: Secondary | ICD-10-CM

## 2019-12-05 DIAGNOSIS — Z113 Encounter for screening for infections with a predominantly sexual mode of transmission: Secondary | ICD-10-CM

## 2019-12-05 DIAGNOSIS — Z124 Encounter for screening for malignant neoplasm of cervix: Secondary | ICD-10-CM

## 2019-12-05 DIAGNOSIS — Z0000 Encounter for general adult medical examination without abnormal findings: Secondary | ICD-10-CM

## 2019-12-05 DIAGNOSIS — R7989 Other specified abnormal findings of blood chemistry: Secondary | ICD-10-CM | POA: Diagnosis not present

## 2019-12-05 DIAGNOSIS — I10 Essential (primary) hypertension: Secondary | ICD-10-CM

## 2019-12-05 NOTE — Patient Instructions (Signed)
The USPSTF recommendations screening of cervical cancer every 3 years with cervical cytology.  All women age 40 to 65 years are at risk for cervical cancer because of potential exposure to high risk HPV types to sexual intercourse and should be screened.  Certainly risk factors further increased risk for cervical cancer including HIV infection, a compromised immune system, and utero exposure to diethylstilbestrol and previous treatment of high-grade precancerous lesions or cervical cancer.  Women with these risk factors should receive individual follow-up 

## 2019-12-05 NOTE — Progress Notes (Signed)
Established Patient Office Visit  Subjective:  Patient ID: Morgan Lee, female    DOB: November 06, 1981  Age: 39 y.o. MRN: [id number]  CC:  Chief Complaint  Patient presents with  . Gynecologic Exam    HPI Morgan Lee presents for well woman exam/check up. She voices no problems or issues.  Past Medical History:  Diagnosis Date  . [redacted] weeks gestation of pregnancy   . Advanced maternal age in multigravida, second trimester   . Depression   . Fatigue   . 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 Palpitation   . SOB (shortness of breath)   . Syncope     Past Surgical History:  Procedure Laterality Date  . CESAREAN SECTION    . HERNIA REPAIR    . TUBAL LIGATION Bilateral 12/21/2017   Procedure: POST PARTUM TUBAL LIGATION;  Surgeon: Chancy Milroy, MD;  Location: [facility];  Service: Gynecology;  Laterality: Bilateral;    Family History  Problem Relation Age of Onset  . Hypertension Mother   . Cancer - Lung Mother   . Hypertension Sister   . Diabetes Sister   . Seizures Sister   . Thyroid disease Sister   . Heart disease Sister        stents  . Asthma Sister   . Hypertension Brother     Social History   Socioeconomic History  . Marital status: Single    Spouse name: Not on file  . Number of children: Not on file  . Years of education: Not on file  . Highest education level: Not on file  Occupational History  . Not on file  Tobacco Use  . Smoking status: Former Research scientist (life sciences)  . Smokeless tobacco: Never Used  Substance and Sexual Activity  . Alcohol use: Yes    Comment: occ./none with preg; stopped when preg  . Drug use: Yes    Types: Marijuana    Comment: daily; stopped once found out preg  . Sexual activity: Yes    Birth control/protection: Surgical    Comment: BTL  Other Topics Concern  . Not on file  Social History Narrative  . Not on file   Social Determinants of Health   Financial Resource Strain:   . Difficulty of Paying Living Expenses:  Not on file  Food Insecurity:   . Worried About Charity fundraiser in the Last Year: Not on file  . Ran Out of Food in the Last Year: Not on file  Transportation Needs:   . Lack of Transportation (Medical): Not on file  . Lack of Transportation (Non-Medical): Not on file  Physical Activity:   . Days of Exercise per Week: Not on file  . Minutes of Exercise per Session: Not on file  Stress:   . Feeling of Stress : Not on file  Social Connections:   . Frequency of Communication with Friends and Family: Not on file  . Frequency of Social Gatherings with Friends and Family: Not on file  . Attends Religious Services: Not on file  . Active Member of Clubs or Organizations: Not on file  . Attends Archivist Meetings: Not on file  . Marital Status: Not on file  Intimate Partner Violence:   . Fear of Current or Ex-Partner: Not on file  . Emotionally Abused: Not on file  . Physically Abused: Not on file  . Sexually Abused: Not on file    Outpatient Medications Prior to Visit  Medication  Sig Dispense Refill  . amLODipine (NORVASC) 5 MG tablet TAKE 1 TABLET (5 MG TOTAL) BY MOUTH DAILY. 90 tablet 1  . Ascorbic Acid (VITAMIN C) 1000 MG tablet Take 500 mg by mouth daily.    . Blood Pressure Monitor KIT 1 kit by Does not apply route 3 (three) times daily. 1 kit 0  . Cholecalciferol (VITAMIN D3 PO) Take by mouth daily.    . hydrochlorothiazide (HYDRODIURIL) 12.5 MG tablet TAKE 1 TABLET (12.5 MG TOTAL) BY MOUTH DAILY. 90 tablet 1  . Multiple Vitamin (MULTIVITAMIN) tablet Take 1 tablet by mouth daily.     No facility-administered medications prior to visit.    No Known Allergies  ROS Review of Systems  All other systems reviewed and are negative.     Objective:    Physical Exam CONSTITUTIONAL: Well-developed, well-nourished female in no acute distress.  HENT:  Normocephalic, atraumatic, External right and left ear normal.  EYES: Conjunctivae and EOM are normal. Pupils are  equal, round, and reactive to light. No scleral icterus.  NECK: Normal range of motion, supple, no masses.  Normal thyroid.  SKIN: Skin is warm and dry. No rash noted. Not diaphoretic. No erythema. No pallor. [place]: Alert and oriented to person, place, and time. Normal reflexes, muscle tone coordination. No cranial nerve deficit noted. PSYCHIATRIC: Normal mood and affect. Normal behavior. Normal judgment and thought content. CARDIOVASCULAR: Normal heart rate noted, regular rhythm RESPIRATORY: Clear to auscultation bilaterally. Effort and breath sounds normal, no problems with respiration noted. BREASTS: Taught SBE ABDOMEN: Soft, normal bowel sounds, no distention noted.  No tenderness, rebound or guarding.  PELVIC: Normal appearing external genitalia; normal appearing vaginal mucosa and cervix.  Scant amount of discharge noted.  Pap smear obtained.  Normal uterine size, no other palpable masses, no uterine or adnexal tenderness. MUSCULOSKELETAL: Normal range of motion. No tenderness.  No cyanosis, clubbing, or edema. BP 128/90   Pulse 73   Temp (!) 97.2 F (36.2 C) (Oral)   Ht _0  (1.6 m)   Wt 149 lb (67.6 kg)   LMP 11/21/2019 (Approximate)   SpO2 97%   BMI 26.39 kg/m  Wt Readings from Last 3 Encounters:  12/05/19 149 lb (67.6 kg)  11/27/19 146 lb 9.6 oz (66.5 kg)  11/18/18 150 lb (68 kg)     Health Maintenance Due  Topic Date Due  . PAP SMEAR-Modifier  06/10/2001    There are no preventive care reminders to display for this patient.  Lab Results  Component Value Date   TSH 0.181 (L) 08/22/2018   Lab Results  Component Value Date   WBC 4.4 08/22/2018   HGB 9.5 (L) 08/22/2018   HCT 31.6 (L) 08/22/2018   MCV 68 (L) 08/22/2018   PLT 448 08/22/2018   Lab Results  Component Value Date   NA 136 08/22/2018   K 4.3 08/22/2018   CO2 23 08/22/2018   GLUCOSE 84 08/22/2018   BUN 8 08/22/2018   CREATININE 0.59 08/22/2018   BILITOT 0.2 08/22/2018   ALKPHOS 86 08/22/2018    AST 25 08/22/2018   ALT 25 08/22/2018   PROT 7.7 08/22/2018   ALBUMIN 4.1 08/22/2018   CALCIUM 9.1 08/22/2018   ANIONGAP 8 12/24/2017   Lab Results  Component Value Date   CHOL 148 08/22/2018   Lab Results  Component Value Date   HDL 66 08/22/2018   Lab Results  Component Value Date   LDLCALC 70 08/22/2018   Lab Results  Component Value  Date   TRIG 60 08/22/2018   Lab Results  Component Value Date   CHOLHDL 2.2 08/22/2018   No results found for: HGBA1C    Assessment & Plan:  Lucero was seen today for gynecologic exam.  Diagnoses and all orders for this visit:  Cervical cancer screening -     Cytology - PAP([place])  Screening for STD (sexually transmitted disease) -     Cervicovaginal ancillary only  Annual physical exam -     CBC with Differential -     Complete Metabolic Panel with GFR  Abnormal TSH -     TSH + free T4  Lipid screening -     Lipid Panel  Hypertension, unspecified type Blood pressure at goal on norvasc 40m and hctz 12.5 </= 130/80 continue  low-sodium, DASH diet, medication compliance, 150 minutes of moderate intensity exercise per week. Discussed medication compliance, adverse effects.     No orders of the defined types were placed in this encounter.   Follow-up: Return in about 6 months (around 06/03/2020) for hypertension tele if she has a monitor.    MKerin Perna NP

## 2019-12-06 LAB — CMP14+EGFR
ALT: 19 IU/L (ref 0–32)
AST: 22 IU/L (ref 0–40)
Albumin/Globulin Ratio: 1.1 — ABNORMAL LOW (ref 1.2–2.2)
Albumin: 4 g/dL (ref 3.8–4.8)
Alkaline Phosphatase: 64 IU/L (ref 39–117)
BUN/Creatinine Ratio: 16 (ref 9–23)
BUN: 11 mg/dL (ref 6–20)
Bilirubin Total: 0.3 mg/dL (ref 0.0–1.2)
CO2: 24 mmol/L (ref 20–29)
Calcium: 9.1 mg/dL (ref 8.7–10.2)
Chloride: 102 mmol/L (ref 96–106)
Creatinine, Ser: 0.69 mg/dL (ref 0.57–1.00)
GFR calc Af Amer: 127 mL/min/{1.73_m2} (ref >59)
GFR calc non Af Amer: 110 mL/min/{1.73_m2} (ref >59)
Globulin, Total: 3.7 g/dL (ref 1.5–4.5)
Glucose: 99 mg/dL (ref 65–99)
Potassium: 4 mmol/L (ref 3.5–5.2)
Sodium: 138 mmol/L (ref 134–144)
Total Protein: 7.7 g/dL (ref 6.0–8.5)

## 2019-12-06 LAB — CBC WITH DIFFERENTIAL/PLATELET
Basophils Absolute: 0 10*3/uL (ref 0.0–0.2)
Basos: 1 %
EOS (ABSOLUTE): 0.5 10*3/uL — ABNORMAL HIGH (ref 0.0–0.4)
Eos: 11 %
Hematocrit: 33.6 % — ABNORMAL LOW (ref 34.0–46.6)
Hemoglobin: 10.4 g/dL — ABNORMAL LOW (ref 11.1–15.9)
Immature Grans (Abs): 0 10*3/uL (ref 0.0–0.1)
Immature Granulocytes: 0 %
Lymphocytes Absolute: 1.4 10*3/uL (ref 0.7–3.1)
Lymphs: 33 %
MCH: 22.5 pg — ABNORMAL LOW (ref 26.6–33.0)
MCHC: 31 g/dL — ABNORMAL LOW (ref 31.5–35.7)
MCV: 73 fL — ABNORMAL LOW (ref 79–97)
Monocytes Absolute: 0.4 10*3/uL (ref 0.1–0.9)
Monocytes: 9 %
Neutrophils Absolute: 2 10*3/uL (ref 1.4–7.0)
Neutrophils: 46 %
Platelets: 344 10*3/uL (ref 150–450)
RBC: 4.62 x10E6/uL (ref 3.77–5.28)
RDW: 17.5 % — ABNORMAL HIGH (ref 11.7–15.4)
WBC: 4.2 10*3/uL (ref 3.4–10.8)

## 2019-12-06 LAB — LIPID PANEL
Chol/HDL Ratio: 2.1 ratio (ref 0.0–4.4)
Cholesterol, Total: 155 mg/dL (ref 100–199)
HDL: 75 mg/dL (ref >39)
LDL Chol Calc (NIH): 64 mg/dL (ref 0–99)
Triglycerides: 89 mg/dL (ref 0–149)
VLDL Cholesterol Cal: 16 mg/dL (ref 5–40)

## 2019-12-06 LAB — TSH+FREE T4
Free T4: 1.21 ng/dL (ref 0.82–1.77)
TSH: 1.34 u[IU]/mL (ref 0.450–4.500)

## 2019-12-08 LAB — CERVICOVAGINAL ANCILLARY ONLY
Bacterial Vaginitis (gardnerella): POSITIVE — AB
Candida Glabrata: NEGATIVE
Candida Vaginitis: NEGATIVE
Chlamydia: NEGATIVE
Comment: NEGATIVE
Comment: NEGATIVE
Comment: NEGATIVE
Comment: NEGATIVE
Comment: NEGATIVE
Comment: NORMAL
Neisseria Gonorrhea: NEGATIVE
Trichomonas: NEGATIVE

## 2019-12-09 ENCOUNTER — Other Ambulatory Visit (INDEPENDENT_AMBULATORY_CARE_PROVIDER_SITE_OTHER): Payer: Self-pay | Admitting: Primary Care

## 2019-12-09 MED ORDER — METRONIDAZOLE 500 MG PO TABS: 500.0000 mg | ORAL_TABLET | Freq: Two times a day (BID) | ORAL | 0 refills | Status: DC

## 2019-12-30 ENCOUNTER — Other Ambulatory Visit: Payer: Self-pay

## 2019-12-30 ENCOUNTER — Encounter (INDEPENDENT_AMBULATORY_CARE_PROVIDER_SITE_OTHER): Payer: Self-pay

## 2019-12-30 ENCOUNTER — Ambulatory Visit (INDEPENDENT_AMBULATORY_CARE_PROVIDER_SITE_OTHER): Payer: Medicaid Other

## 2020-01-13 ENCOUNTER — Telehealth (INDEPENDENT_AMBULATORY_CARE_PROVIDER_SITE_OTHER): Payer: Self-pay | Admitting: Licensed Clinical Social Worker

## 2020-01-13 ENCOUNTER — Institutional Professional Consult (permissible substitution) (INDEPENDENT_AMBULATORY_CARE_PROVIDER_SITE_OTHER): Payer: Medicaid Other | Admitting: Licensed Clinical Social Worker

## 2020-01-13 NOTE — Telephone Encounter (Signed)
Call placed to patient regarding scheduled [facility] appointment. LCSW was unable to leave a message due to pt's voicemail box not being set up.

## 2020-01-14 ENCOUNTER — Other Ambulatory Visit: Payer: Self-pay

## 2020-01-14 ENCOUNTER — Ambulatory Visit: Payer: Medicaid Other | Attending: Family Medicine | Admitting: Physical Therapy

## 2020-01-14 ENCOUNTER — Encounter: Payer: Self-pay | Admitting: Physical Therapy

## 2020-01-14 DIAGNOSIS — R293 Abnormal posture: Secondary | ICD-10-CM | POA: Insufficient documentation

## 2020-01-14 DIAGNOSIS — M6281 Muscle weakness (generalized): Secondary | ICD-10-CM | POA: Diagnosis present

## 2020-01-14 DIAGNOSIS — M25512 Pain in left shoulder: Secondary | ICD-10-CM | POA: Insufficient documentation

## 2020-01-14 DIAGNOSIS — G8929 Other chronic pain: Secondary | ICD-10-CM | POA: Diagnosis present

## 2020-01-14 NOTE — Patient Instructions (Signed)
Access Code: [email or web address]  URL: [email or web address]  Date: 01/14/2020  Prepared by: Rosana Hoes   Exercises Seated Cervical Sidebending Stretch - 3 reps - 15 seconds hold - 2x daily - 7x weekly Seated Cervical Retraction - 10 reps - 3 seconds hold - 2x daily - 7x weekly Banded Row - 15 reps - 2 seconds hold - 2x daily - 7x weekly Shoulder External Rotation and Scapular Retraction with Resistance - 15 reps - 2 seconds hold - 2x daily - 7x weekly

## 2020-01-15 NOTE — Therapy (Addendum)
[place], [place], [address] Phone: [phone]   Fax:  [phone]  Physical Therapy Evaluation/Discharge  Patient Details  Name: Morgan Lee MRN: [id number] Date of Birth: 10/02/1981 Referring Provider (PT): Eunice Blase, MD   Encounter Date: 01/14/2020  PT End of Session - 01/14/20 1704    Visit Number  1    Number of Visits  8    Date for PT Re-Evaluation  03/10/20   MCD reauthorization after 3rd authorized visit   Authorization Type  MCD    PT Start Time  1701    PT Stop Time  1745    PT Time Calculation (min)  44 min    Activity Tolerance  Patient tolerated treatment well    Behavior During Therapy  [facility] for tasks assessed/performed       Past Medical History:  Diagnosis Date  . [redacted] weeks gestation of pregnancy   . Advanced maternal age in multigravida, second trimester   . Depression   . Fatigue   . 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 Palpitation   . SOB (shortness of breath)   . Syncope     Past Surgical History:  Procedure Laterality Date  . CESAREAN SECTION    . HERNIA REPAIR    . TUBAL LIGATION Bilateral 12/21/2017   Procedure: POST PARTUM TUBAL LIGATION;  Surgeon: Chancy Milroy, MD;  Location: [facility];  Service: Gynecology;  Laterality: Bilateral;    There were no vitals filed for this visit.   Subjective Assessment - 01/14/20 1706    Subjective  Patient reports left shoulder and upper arm numbness like it falls asleep. This occurs occasionally and then when she moves the arm the numbness will go away. Sometimes the numbness will start from the left side of the neck and it will go down to the elbow. No mechanism that started this onset, she reports it just gradually came on when she was sitting more.    Limitations  Other (comment)   patient reports no limitations until numbness comes on and that is random   How long can you sit comfortably?  No limitation    How long can you stand comfortably?  No limitation    How long can you walk comfortably?  No limitation    Diagnostic tests  None    Patient Stated Goals  Get rid of the numbness    Currently in Pain?  Yes    Pain Score  --   unable to rate   Pain Location  Shoulder    Pain Orientation  Left    Pain Descriptors / Indicators  Numbness    Pain Type  Chronic pain    Pain Radiating Towards  sometimes from neck to the elbow    Pain Onset  More than a month ago    Pain Frequency  Intermittent    Aggravating Factors   Nothing consistently    Pain Relieving Factors  Moving the shoulder around    Effect of Pain on Daily Activities  Patient reports she is not limited until numbness comes on         [facility] PT Assessment - 01/15/20 0001      Assessment   Medical Diagnosis  Left arm numbness    Referring Provider (PT)  Hilts, Legrand Como, MD    Onset Date/Surgical Date  --   >5-6 months   Hand Dominance  Right    Next  MD Visit  Not scheduled    Prior Therapy  None      Precautions   Precautions  None      Restrictions   Weight Bearing Restrictions  No      Balance Screen   Has the patient fallen in the past 6 months  No    Has the patient had a decrease in activity level because of a fear of falling?   No    Is the patient reluctant to leave their home because of a fear of falling?   No      Home Film/video editor residence    Living Arrangements  Spouse/significant other;Children      Prior Function   Level of Independence  Independent      Cognition   Overall Cognitive Status  Within Functional Limits for tasks assessed      Observation/Other Assessments   Observations  Patient appears in no apparent distress    Focus on Therapeutic Outcomes (FOTO)   29% limitation      Posture/Postural Control   Posture Comments  Patient exhibits rounded shoulder and forward head posture, shoulders appear elevated with upper trap dominant movements      ROM /  Strength   AROM / PROM / Strength  AROM;Strength      AROM   Overall AROM Comments  All shoulder and cervical AROM grossly WNL and non pain with overpressure, report of upper trap tightness with cervical right sidebend      Strength   Overall Strength Comments  Periscapular strength grossly 4-/5 MMT bilaterally, all other 5/5 MMT      Palpation   Spinal mobility  WFL and non painful for cervical and upper thoracic    Palpation comment  TTP left > right upper trap region with palpable trigger points on left      Special Tests   Other special tests  All cervical radicular testing negative, shoulder impingment testing negative      Transfers   Transfers  Independent with all Transfers                Objective measurements completed on examination: See above findings.      [facility] PT Treatment/Exercise - 01/15/20 0001      Exercises   Exercises  Shoulder      Shoulder Exercises: Seated   Other Seated Exercises  Seated chin tuck 3 sec hold x10 - required mod-max cueing for technique      Shoulder Exercises: Standing   External Rotation  15 reps   2 sec hold   Theraband Level (Shoulder External Rotation)  Level 1 (Yellow)    External Rotation Limitations  bilateral, mod cueing to avoid shrug    Row  15 reps   2 sec hold   Theraband Level (Shoulder Row)  Level 1 (Yellow)    Row Limitations  mod-max cueing to avoid shrug      Shoulder Exercises: Stretch   Other Shoulder Stretches  Seated upper trap stretch 3x15 sec each             PT Education - 01/14/20 1704    Education Details  Exam findings, POC, HEP, possible use of dry needling    Person(s) Educated  Patient    Methods  Explanation;Demonstration;Tactile cues;Verbal cues;Handout    Comprehension  Verbalized understanding;Returned demonstration;Verbal cues required;Tactile cues required;Need further instruction       PT Short Term Goals - 01/14/20  [place] #1   Title   Patient will be I in initial HEP to progress in PT    Baseline  Given at eval    Time  4    Period  Weeks    Status  New    Target Date  02/11/20      PT SHORT TERM GOAL #2   Title  Patient will understand and demonstrate proper posture to reduce upper trap dominance and tightness and alleviate numbness    Baseline  Patient exhibits rounded shoulder and forward head posture with upper trap dominant movements    Time  4    Period  Weeks    Status  New    Target Date  02/11/20      PT SHORT TERM GOAL #3   Title  Patient will report 50% reduction in onset and frequency of left shoulder numbness to reduce lifting limitations overhead    Baseline  Currently she report frequent onset of numbness throughout the day, mild difficulty lifting/carrying 10 lbs overhead    Time  4    Period  Weeks    Status  New    Target Date  02/11/20        PT Long Term Goals - 01/15/20 0813      PT LONG TERM GOAL #1   Title  Patient will be I with final advanced HEP to maintain progress in PT    Baseline  Given at eval    Time  8    Period  Weeks    Status  New    Target Date  03/10/20      PT LONG TERM GOAL #2   Title  Patient will report 90% reduction in onset of and frequency of numbness to allow for normal daily acitvities and hobbies without limitation    Baseline  Currently she report frequent onset of numbness throughout the day, mild difficulty with daily activities and hobbies    Time  8    Period  Weeks    Status  New    Target Date  03/10/20      PT LONG TERM GOAL #3   Title  Patient will exhibit improve gross periscapular muscular strength of >/=4/5 MMT to allow for improved postural control and reduce onset of numbness when sitting extended periods    Baseline  Grossly 4-/5 MMT    Time  8    Period  Weeks    Status  New    Target Date  03/10/20      PT LONG TERM GOAL #4   Title  Patient will report improved functional level to </= 27% limitation on FOTO    Baseline  29%  limitation    Time  8    Period  Weeks    Status  New    Target Date  03/10/20             Plan - 01/15/20 0802    Clinical Impression Statement  Patient presents to PT with report of numbness of the left shoulder and upper arm that seems to occur mainly when she is sitting for extended periods but patient reports can occur randomly at any time. She exhibits postural impairments including rounded shoulder, forward head, and upper trap dominant with movemens. She increased tension/trigger points of left upper trap and strength deficits of periscapular musculature. She did not exhibit any radicular symptoms with testing this visit,  her symptoms seem to be mostly postural related as movement and repositionin reduce the numbness. She was provided with exercises to improve posture with good tolerance. She would benefit from continued skilled PT to reduce upper trap tightness and progress postural control to reduce feeling of numbness in shoulder.    Personal Factors and Comorbidities  Time since onset of injury/illness/exacerbation    Examination-Activity Limitations  Carry;Lift    Examination-Participation Restrictions  Community Activity;Shop    Stability/Clinical Decision Making  Stable/Uncomplicated    Clinical Decision Making  Low    Rehab Potential  Good    PT Frequency  1x / week    PT Duration  8 weeks    PT Treatment/Interventions  Cryotherapy;Electrical Stimulation;Moist Heat;Neuromuscular re-education;Therapeutic exercise;Therapeutic activities;Patient/family education;Manual techniques;Dry needling;Passive range of motion;Spinal Manipulations;Joint Manipulations;Taping    PT Next Visit Plan  Assess HEP and progress PRN, manual/dry needling for left upper trap and cervical region, postural control exercises    PT Home Exercise Plan  Upper trap stretch, seated chin tucks, row with yellow, bilateral ER with yellow    Consulted and Agree with Plan of Care  Patient       Patient will  benefit from skilled therapeutic intervention in order to improve the following deficits and impairments:  Impaired flexibility, Pain, Postural dysfunction, Decreased strength  Visit Diagnosis: Chronic left shoulder pain  Muscle weakness (generalized)  Abnormal posture     Problem List Patient Active Problem List   Diagnosis Date Noted  . Gestational hypertension 12/22/2017  . Indication for care in labor or delivery 12/20/2017  . [redacted] weeks gestation of pregnancy   . Advanced maternal age in multigravida, second trimester   . Anemia complicating pregnancy in third trimester 06/23/2012  . Desires VBAC (vaginal birth after cesarean) trial 04/23/2012  . Prenatal care, subsequent pregnancy 04/23/2012    Hilda Blades, PT, DPT, LAT, ATC 01/15/20  8:31 AM Phone: [phone] Fax: [facility] [address] [place], [place], [address] Phone: [phone]   Fax:  [phone]  Name: Morgan Lee MRN: [id number] Date of Birth: 1981/05/03   PHYSICAL THERAPY DISCHARGE SUMMARY  Visits from Start of Care: 1  Current functional level related to goals / functional outcomes: See above   Remaining deficits: See above   Education / Equipment: Anatomy of condition, POC, HEP, exercise form/rationale  Plan: Patient agrees to discharge.  Patient goals were not met. Patient is being discharged due to not returning since the last visit.  ?????    Spoke with patient via phone. She reported she thought her visits were in April. As she is outside of her authorization dates I requested that she obtain a new referral and we will reschedule her for re-evaluation. She verbalized understanding.  Jessica C. Hightower PT, DPT 02/12/20 4:42 PM

## 2020-01-20 ENCOUNTER — Other Ambulatory Visit: Payer: Self-pay

## 2020-01-20 ENCOUNTER — Ambulatory Visit (LOCAL_COMMUNITY_HEALTH_CENTER): Payer: Self-pay

## 2020-01-20 DIAGNOSIS — Z0184 Encounter for antibody response examination: Secondary | ICD-10-CM

## 2020-01-20 NOTE — Progress Notes (Signed)
Pt states she had chicken pox virus when she was a child. Pt into clinic today requesting varicella titer. Pt reviewed and signed ROI for results. Pt to sent to lab.

## 2020-01-21 LAB — VARICELLA ZOSTER ANTIBODY, IGG: Varicella zoster IgG: 532 index (ref >165)

## 2020-01-22 ENCOUNTER — Telehealth: Payer: Self-pay

## 2020-01-22 NOTE — Telephone Encounter (Signed)
Phone call to pt. Pt counseled that varicella titer indicates she is immune to varicella/chicken pox; this means a varicella vaccine is not needed. Pt informed a copy will be placed at the front desk for her to pick up.

## 2020-01-29 ENCOUNTER — Ambulatory Visit: Payer: Medicaid Other | Attending: Family Medicine | Admitting: Physical Therapy

## 2020-02-05 ENCOUNTER — Ambulatory Visit: Payer: Medicaid Other | Admitting: Physical Therapy

## 2020-02-12 ENCOUNTER — Ambulatory Visit: Payer: Medicaid Other | Admitting: Physical Therapy

## 2020-03-30 ENCOUNTER — Ambulatory Visit (INDEPENDENT_AMBULATORY_CARE_PROVIDER_SITE_OTHER): Payer: Medicaid Other | Admitting: Primary Care

## 2020-04-05 ENCOUNTER — Ambulatory Visit (INDEPENDENT_AMBULATORY_CARE_PROVIDER_SITE_OTHER): Payer: Medicaid Other | Admitting: Primary Care

## 2020-04-13 ENCOUNTER — Ambulatory Visit (INDEPENDENT_AMBULATORY_CARE_PROVIDER_SITE_OTHER): Payer: Medicaid Other | Admitting: Primary Care

## 2020-05-10 ENCOUNTER — Other Ambulatory Visit (INDEPENDENT_AMBULATORY_CARE_PROVIDER_SITE_OTHER): Payer: Self-pay | Admitting: Primary Care

## 2020-05-10 DIAGNOSIS — I10 Essential (primary) hypertension: Secondary | ICD-10-CM

## 2020-05-10 NOTE — Telephone Encounter (Signed)
Sent to PCP ?

## 2020-05-27 ENCOUNTER — Other Ambulatory Visit: Payer: Self-pay | Admitting: Family Medicine

## 2020-05-27 DIAGNOSIS — I10 Essential (primary) hypertension: Secondary | ICD-10-CM

## 2020-06-18 ENCOUNTER — Other Ambulatory Visit (INDEPENDENT_AMBULATORY_CARE_PROVIDER_SITE_OTHER): Payer: Self-pay | Admitting: Primary Care

## 2020-06-18 DIAGNOSIS — I10 Essential (primary) hypertension: Secondary | ICD-10-CM

## 2020-08-23 ENCOUNTER — Other Ambulatory Visit (INDEPENDENT_AMBULATORY_CARE_PROVIDER_SITE_OTHER): Payer: Self-pay | Admitting: Primary Care

## 2020-08-23 DIAGNOSIS — I10 Essential (primary) hypertension: Secondary | ICD-10-CM

## 2020-08-23 NOTE — Telephone Encounter (Signed)
LOV 12/05/19. TC to patient. Appointment made for 09/01/20.  Courtesy supply of 10 provided until the scheduled appointment.

## 2020-08-28 ENCOUNTER — Encounter (HOSPITAL_COMMUNITY): Payer: Self-pay

## 2020-08-28 ENCOUNTER — Other Ambulatory Visit: Payer: Self-pay

## 2020-08-28 ENCOUNTER — Ambulatory Visit (HOSPITAL_COMMUNITY)
Admission: EM | Admit: 2020-08-28 | Discharge: 2020-08-28 | Disposition: A | Discharge status: Home / Self Care | Payer: Medicaid Other | Attending: Family Medicine | Admitting: Family Medicine

## 2020-08-28 DIAGNOSIS — R6883 Chills (without fever): Secondary | ICD-10-CM | POA: Diagnosis not present

## 2020-08-28 DIAGNOSIS — Z87891 Personal history of nicotine dependence: Secondary | ICD-10-CM | POA: Insufficient documentation

## 2020-08-28 DIAGNOSIS — R52 Pain, unspecified: Secondary | ICD-10-CM | POA: Diagnosis not present

## 2020-08-28 DIAGNOSIS — Z20822 Contact with and (suspected) exposure to covid-19: Secondary | ICD-10-CM | POA: Insufficient documentation

## 2020-08-28 NOTE — ED Triage Notes (Signed)
Pt present body aches and chills. Symptoms started last night.

## 2020-08-28 NOTE — Discharge Instructions (Addendum)
You have been tested for COVID-19 today. °If your test returns positive, you will receive a phone call from [place] regarding your results. °Negative test results are not called. °Both positive and negative results area always visible on MyChart. °If you do not have a MyChart account, sign up instructions are provided in your discharge papers. °Please do not hesitate to contact us should you have questions or concerns. ° °

## 2020-08-29 LAB — SARS CORONAVIRUS 2 (TAT 6-24 HRS): SARS Coronavirus 2: NEGATIVE

## 2020-08-30 NOTE — ED Provider Notes (Signed)
[facility]   [id number] 08/28/20 Arrival Time: 1557  ASSESSMENT & PLAN:  1. Chills   2. Generalized body aches      COVID-19 testing sent. See letter/work note on file for self-isolation guidelines. OTC symptom care as needed.    Follow-up Information    Grayce Sessions, NP.   Specialty: Internal Medicine Why: As needed. Contact information: [address] [place] [address] [phone]               Reviewed expectations re: course of current medical issues. Questions answered. Outlined signs and symptoms indicating need for more acute intervention. Understanding verbalized. After Visit Summary given.   SUBJECTIVE: History from: patient. Morgan Lee is a 39 y.o. female who presents with worries regarding COVID-19. Known COVID-19 contact: none. Recent travel: none. Reports: body aches and chills x 1 day. Denies: fever and difficulty breathing. Normal PO intake without n/v/d.    OBJECTIVE:  Vitals:   08/28/20 1713  BP: 118/79  Pulse: (!) 101  Resp: 18  Temp: 98.3 F (36.8 C)  TempSrc: Oral  SpO2: 100%    General appearance: alert; no distress Eyes: PERRLA; EOMI; conjunctiva normal HENT: [place]; AT; without nasal congestion Neck: supple  Lungs: speaks full sentences without difficulty; unlabored Extremities: no edema Skin: warm and dry Neurologic: normal gait Psychological: alert and cooperative; normal mood and affect  Labs:  Labs Reviewed  SARS CORONAVIRUS 2 (TAT 6-24 HRS)     No Known Allergies  Past Medical History:  Diagnosis Date  . [redacted] weeks gestation of pregnancy   . Advanced maternal age in multigravida, second trimester   . Depression   . Fatigue   . 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 Palpitation   . SOB (shortness of breath)   . Syncope    Social History   Socioeconomic History  . Marital status: Single    Spouse name: Not on file  . Number of children: Not on file  . Years of education: Not  on file  . Highest education level: Not on file  Occupational History  . Not on file  Tobacco Use  . Smoking status: Former Games developer  . Smokeless tobacco: Never Used  Substance and Sexual Activity  . Alcohol use: Yes    Comment: occ./none with preg; stopped when preg  . Drug use: Yes    Types: Marijuana    Comment: daily; stopped once found out preg  . Sexual activity: Yes    Birth control/protection: Surgical    Comment: BTL  Other Topics Concern  . Not on file  Social History Narrative  . Not on file   Social Determinants of Health   Financial Resource Strain:   . Difficulty of Paying Living Expenses: Not on file  Food Insecurity:   . Worried About Programme researcher, broadcasting/film/video in the Last Year: Not on file  . Ran Out of Food in the Last Year: Not on file  Transportation Needs:   . Lack of Transportation (Medical): Not on file  . Lack of Transportation (Non-Medical): Not on file  Physical Activity:   . Days of Exercise per Week: Not on file  . Minutes of Exercise per Session: Not on file  Stress:   . Feeling of Stress : Not on file  Social Connections:   . Frequency of Communication with Friends and Family: Not on file  . Frequency of Social Gatherings with Friends and Family: Not on file  . Attends  Religious Services: Not on file  . Active Member of Clubs or Organizations: Not on file  . Attends Banker Meetings: Not on file  . Marital Status: Not on file  Intimate Partner Violence:   . Fear of Current or Ex-Partner: Not on file  . Emotionally Abused: Not on file  . Physically Abused: Not on file  . Sexually Abused: Not on file   Family History  Problem Relation Age of Onset  . Hypertension Mother   . Cancer - Lung Mother   . Hypertension Sister   . Diabetes Sister   . Seizures Sister   . Thyroid disease Sister   . Heart disease Sister        stents  . Asthma Sister   . Hypertension Brother    Past Surgical History:  Procedure Laterality Date  .  CESAREAN SECTION    . HERNIA REPAIR    . TUBAL LIGATION Bilateral 12/21/2017   Procedure: POST PARTUM TUBAL LIGATION;  Surgeon: Hermina Staggers, MD;  Location: [facility];  Service: Gynecology;  Laterality: Bilateral;     Mardella Layman, MD 08/30/20 [phone]

## 2020-09-01 ENCOUNTER — Ambulatory Visit (INDEPENDENT_AMBULATORY_CARE_PROVIDER_SITE_OTHER): Payer: Medicaid Other | Admitting: Primary Care

## 2020-09-01 ENCOUNTER — Telehealth (INDEPENDENT_AMBULATORY_CARE_PROVIDER_SITE_OTHER): Payer: Self-pay | Admitting: Primary Care

## 2020-09-01 NOTE — Telephone Encounter (Signed)
Called patient to reschedule appointment from 09/01/2020 that was no-showed. Pt voice-mail was full.

## 2020-12-22 ENCOUNTER — Ambulatory Visit (INDEPENDENT_AMBULATORY_CARE_PROVIDER_SITE_OTHER): Payer: Medicaid Other

## 2020-12-22 ENCOUNTER — Other Ambulatory Visit: Payer: Self-pay

## 2020-12-22 ENCOUNTER — Ambulatory Visit (HOSPITAL_COMMUNITY)
Admission: EM | Admit: 2020-12-22 | Discharge: 2020-12-22 | Disposition: A | Discharge status: Home / Self Care | Payer: Medicaid Other | Attending: Family Medicine | Admitting: Family Medicine

## 2020-12-22 ENCOUNTER — Encounter (HOSPITAL_COMMUNITY): Payer: Self-pay

## 2020-12-22 DIAGNOSIS — S6992XA Unspecified injury of left wrist, hand and finger(s), initial encounter: Secondary | ICD-10-CM

## 2020-12-22 DIAGNOSIS — M7989 Other specified soft tissue disorders: Secondary | ICD-10-CM | POA: Diagnosis not present

## 2020-12-22 DIAGNOSIS — Z87891 Personal history of nicotine dependence: Secondary | ICD-10-CM | POA: Diagnosis not present

## 2020-12-22 DIAGNOSIS — M79645 Pain in left finger(s): Secondary | ICD-10-CM | POA: Diagnosis not present

## 2020-12-22 DIAGNOSIS — J069 Acute upper respiratory infection, unspecified: Secondary | ICD-10-CM

## 2020-12-22 DIAGNOSIS — X58XXXA Exposure to other specified factors, initial encounter: Secondary | ICD-10-CM | POA: Diagnosis not present

## 2020-12-22 DIAGNOSIS — U071 COVID-19: Secondary | ICD-10-CM | POA: Diagnosis not present

## 2020-12-22 DIAGNOSIS — R059 Cough, unspecified: Secondary | ICD-10-CM | POA: Diagnosis present

## 2020-12-22 LAB — SARS CORONAVIRUS 2 (TAT 6-24 HRS): SARS Coronavirus 2: POSITIVE — AB

## 2020-12-22 MED ORDER — BENZONATATE 100 MG PO CAPS: ORAL_CAPSULE | ORAL | 0 refills | Status: DC
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
## 2020-12-22 NOTE — Discharge Instructions (Signed)
Continue taking over the counter Aleve twice daily with food.  You have been tested for COVID-19 today. If your test returns positive, you will receive a phone call from [facility] regarding your results. Negative test results are not called. Both positive and negative results area always visible on MyChart. If you do not have a MyChart account, sign up instructions are provided in your discharge papers. Please do not hesitate to contact us should you have questions or concerns.
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
## 2020-12-23 ENCOUNTER — Telehealth (HOSPITAL_COMMUNITY): Payer: Self-pay | Admitting: Family

## 2020-12-23 ENCOUNTER — Telehealth: Payer: Self-pay | Admitting: Infectious Diseases

## 2020-12-23 NOTE — Telephone Encounter (Addendum)
Called to discuss with patient about COVID-19 symptoms and the use of one of the available treatments for those with mild to moderate Covid symptoms and at a high risk of hospitalization.  Pt appears to qualify for outpatient treatment due to co-morbid conditions and/or a member of an at-risk group in accordance with the FDA Emergency Use Authorization.     Unable to reach pt - No VM left as VM box has not been set up.  [employer]

## 2020-12-23 NOTE — Telephone Encounter (Signed)
Called to discuss with patient about COVID-19 symptoms and the use of one of the available treatments for those with mild to moderate Covid symptoms and at a high risk of hospitalization.  Pt appears to qualify for outpatient treatment due to co-morbid conditions and/or a member of an at-risk group in accordance with the FDA Emergency Use Authorization.    Symptom onset: "4-5 days ago" Vaccinated: no Booster?  Immunocompromised? no Qualifiers: SVI risk 4  Unable to reach pt - unable to LVM. MyChart sent.    Morgan Lee

## 2021-01-20 ENCOUNTER — Encounter (HOSPITAL_COMMUNITY): Payer: Self-pay

## 2021-01-20 ENCOUNTER — Emergency Department (HOSPITAL_COMMUNITY)
Admission: EM | Admit: 2021-01-20 | Discharge: 2021-01-21 | Disposition: A | Discharge status: Home / Self Care | Payer: Medicaid Other | Attending: Emergency Medicine | Admitting: Emergency Medicine

## 2021-01-20 ENCOUNTER — Other Ambulatory Visit: Payer: Self-pay

## 2021-01-20 ENCOUNTER — Emergency Department (HOSPITAL_COMMUNITY): Payer: Medicaid Other

## 2021-01-20 DIAGNOSIS — R102 Pelvic and perineal pain: Secondary | ICD-10-CM | POA: Diagnosis present

## 2021-01-20 DIAGNOSIS — Z87891 Personal history of nicotine dependence: Secondary | ICD-10-CM | POA: Diagnosis not present

## 2021-01-20 DIAGNOSIS — N83209 Unspecified ovarian cyst, unspecified side: Secondary | ICD-10-CM

## 2021-01-20 DIAGNOSIS — Z79899 Other long term (current) drug therapy: Secondary | ICD-10-CM | POA: Diagnosis not present

## 2021-01-20 DIAGNOSIS — N83291 Other ovarian cyst, right side: Secondary | ICD-10-CM | POA: Insufficient documentation

## 2021-01-20 DIAGNOSIS — N83201 Unspecified ovarian cyst, right side: Secondary | ICD-10-CM

## 2021-01-20 LAB — COMPREHENSIVE METABOLIC PANEL
ALT: 31 U/L (ref 0–44)
AST: 33 U/L (ref 15–41)
Albumin: 3.9 g/dL (ref 3.5–5.0)
Alkaline Phosphatase: 63 U/L (ref 38–126)
Anion gap: 8 (ref 5–15)
BUN: 15 mg/dL (ref 6–20)
CO2: 25 mmol/L (ref 22–32)
Calcium: 8.8 mg/dL — ABNORMAL LOW (ref 8.9–10.3)
Chloride: 101 mmol/L (ref 98–111)
Creatinine, Ser: 0.63 mg/dL (ref 0.44–1.00)
GFR, Estimated: >60 mL/min (ref >60)
Glucose, Bld: 98 mg/dL (ref 70–99)
Potassium: 3.3 mmol/L — ABNORMAL LOW (ref 3.5–5.1)
Sodium: 134 mmol/L — ABNORMAL LOW (ref 135–145)
Total Bilirubin: 0.4 mg/dL (ref 0.3–1.2)
Total Protein: 7.8 g/dL (ref 6.5–8.1)

## 2021-01-20 LAB — CBC WITH DIFFERENTIAL/PLATELET
Abs Immature Granulocytes: 0.01 10*3/uL (ref 0.00–0.07)
Basophils Absolute: 0 10*3/uL (ref 0.0–0.1)
Basophils Relative: 1 %
Eosinophils Absolute: 0.6 10*3/uL — ABNORMAL HIGH (ref 0.0–0.5)
Eosinophils Relative: 11 %
HCT: 33.2 % — ABNORMAL LOW (ref 36.0–46.0)
Hemoglobin: 10.2 g/dL — ABNORMAL LOW (ref 12.0–15.0)
Immature Granulocytes: 0 %
Lymphocytes Relative: 34 %
Lymphs Abs: 1.9 10*3/uL (ref 0.7–4.0)
MCH: 22.2 pg — ABNORMAL LOW (ref 26.0–34.0)
MCHC: 30.7 g/dL (ref 30.0–36.0)
MCV: 72.2 fL — ABNORMAL LOW (ref 80.0–100.0)
Monocytes Absolute: 0.5 10*3/uL (ref 0.1–1.0)
Monocytes Relative: 9 %
Neutro Abs: 2.5 10*3/uL (ref 1.7–7.7)
Neutrophils Relative %: 45 %
Platelets: 348 10*3/uL (ref 150–400)
RBC: 4.6 MIL/uL (ref 3.87–5.11)
RDW: 18.5 % — ABNORMAL HIGH (ref 11.5–15.5)
WBC: 5.5 10*3/uL (ref 4.0–10.5)
nRBC: 0 % (ref 0.0–0.2)

## 2021-01-20 LAB — I-STAT BETA HCG BLOOD, ED (MC, WL, AP ONLY): I-stat hCG, quantitative: <5 m[IU]/mL (ref <5)

## 2021-01-20 MED ORDER — ONDANSETRON HCL 4 MG/2ML IJ SOLN
4.0000 mg | Freq: Once | INTRAMUSCULAR | Status: AC
  Administered 2021-01-20: 4 mg via INTRAVENOUS
  Filled 2021-01-20: qty 2

## 2021-01-20 MED ORDER — MORPHINE SULFATE (PF) 4 MG/ML IV SOLN
4.0000 mg | Freq: Once | INTRAVENOUS | Status: AC
Start: 2021-01-20 — End: 2021-01-20
  Administered 2021-01-20: 4 mg via INTRAVENOUS
  Filled 2021-01-20: qty 1

## 2021-01-20 NOTE — ED Triage Notes (Signed)
Pt complains of sudden right lower abd pain tonight, no vomiting or diarrhea

## 2021-01-21 ENCOUNTER — Encounter (HOSPITAL_COMMUNITY): Payer: Self-pay

## 2021-01-21 ENCOUNTER — Emergency Department (HOSPITAL_COMMUNITY): Payer: Medicaid Other

## 2021-01-21 LAB — URINALYSIS, ROUTINE W REFLEX MICROSCOPIC
Bilirubin Urine: NEGATIVE
Glucose, UA: NEGATIVE mg/dL
Ketones, ur: NEGATIVE mg/dL
Leukocytes,Ua: NEGATIVE
Nitrite: NEGATIVE
Protein, ur: 30 mg/dL — AB
RBC / HPF: >50 RBC/hpf — ABNORMAL HIGH (ref 0–5)
Specific Gravity, Urine: 1.019 (ref 1.005–1.030)
pH: 5 (ref 5.0–8.0)

## 2021-01-21 MED ORDER — OXYCODONE-ACETAMINOPHEN 5-325 MG PO TABS: 1.0000 | ORAL_TABLET | Freq: Three times a day (TID) | ORAL | 0 refills | Status: DC | PRN

## 2021-01-21 MED ORDER — ACETAMINOPHEN-CODEINE #3 300-30 MG PO TABS: 1.0000 | ORAL_TABLET | Freq: Three times a day (TID) | ORAL | 0 refills | Status: DC | PRN

## 2021-01-21 MED ORDER — ONDANSETRON 4 MG PO TBDP: 4.0000 mg | ORAL_TABLET | Freq: Three times a day (TID) | ORAL | 0 refills | Status: DC | PRN

## 2021-01-21 MED ORDER — IOHEXOL 300 MG/ML  SOLN
100.0000 mL | Freq: Once | INTRAMUSCULAR | Status: AC | PRN
  Administered 2021-01-21: 100 mL via INTRAVENOUS

## 2021-01-21 MED ORDER — KETOROLAC TROMETHAMINE 30 MG/ML IJ SOLN
30.0000 mg | Freq: Once | INTRAMUSCULAR | Status: AC
  Administered 2021-01-21: 30 mg via INTRAVENOUS
  Filled 2021-01-21: qty 1

## 2021-01-21 MED ORDER — OXYCODONE-ACETAMINOPHEN 5-325 MG PO TABS
1.0000 | ORAL_TABLET | Freq: Once | ORAL | Status: DC
  Filled 2021-01-21: qty 1

## 2021-01-21 NOTE — Discharge Instructions (Addendum)
Thank you for allowing me to care for you today in the Emergency Department.   You were seen today for right-sided abdominal and pelvic pain.  I suspect that you may have had an ovarian cyst that ruptured that caused the pain to start suddenly.  You do also have a cyst that is still present on your right ovary.  I am going to have you follow-up with your OB/GYN for this.  Call your OB/GYN's office to schedule a follow-up appointment for re-evaluation within the next week.  For pain control, take 600 mg of ibuprofen once every 6 hours with food.  For severe, uncontrollable pain, you can take 1 tablet of Tylenol #3 every 8 hours.  This is a narcotic.  It can be addicting.  Do not take with other substances or medications that may be sedating, such as alcohol.  You should not take more than 4000 mg of Tylenol from all sources in a 24-hour.  Let 1 tablet of Zofran dissolve in your tongue every 8 hours as needed for nausea or vomiting.  Return to the emergency department if you develop uncontrollable vomiting, severe, uncontrollable pain, if you get dizzy or lightheaded, if you pass out, or have other new, concerning symptoms.

## 2021-01-21 NOTE — ED Provider Notes (Signed)
[facility] DEPT Provider Note   CSN: [id number] Arrival date & time: 01/20/21  2144     History No chief complaint on file.   [facility] is a 40 y.o. female with a history of fatigue, anemia who presents the emergency department with a chief complaint of pelvic pain.  The patient reports sudden onset, intermittent, RLQ abdominal pain, characterized as sharp, the began suddenly at 1700.  She reports that she felt a first episode of pain when she stood up.  She reports that the pain resolved within a few minutes, but returned while she was walking into the kitchen.  She has had multiple episodes of pain since the initial onset.  Pain seems to be worse with positional changes and movement.  She reports when the pain intensifies it brings tears to her eyes.  No nausea, vomiting, fever, chills, diarrhea, constipation, vaginal discharge, dysuria, hematuria, or urinary frequency or hesitancy.  She attempted to treat her pain with Aleve at home with no improvement.  She has no concerns for pregnancy.  She has a history of a cesarean section and hernia repair.  No concerns for STIs.  The history is provided by the patient and medical records. No language interpreter was used.       Past Medical History:  Diagnosis Date  . [redacted] weeks gestation of pregnancy   . Advanced maternal age in multigravida, second trimester   . Depression   . Fatigue   . 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 Palpitation   . SOB (shortness of breath)   . Syncope     Patient Active Problem List   Diagnosis Date Noted  . Gestational hypertension 12/22/2017  . Indication for care in labor or delivery 12/20/2017  . [redacted] weeks gestation of pregnancy   . Advanced maternal age in multigravida, second trimester   . Anemia complicating pregnancy in third trimester 06/23/2012  . Desires VBAC (vaginal birth after cesarean) trial 04/23/2012  . Prenatal care, subsequent pregnancy 04/23/2012     Past Surgical History:  Procedure Laterality Date  . CESAREAN SECTION    . HERNIA REPAIR    . TUBAL LIGATION Bilateral 12/21/2017   Procedure: POST PARTUM TUBAL LIGATION;  Surgeon: Chancy Milroy, MD;  Location: [facility];  Service: Gynecology;  Laterality: Bilateral;     OB History    Gravida  11   Para  4   Term  4   Preterm      AB  7   Living  4     SAB  1   IAB  6   Ectopic      Multiple  0   Live Births  1           Family History  Problem Relation Age of Onset  . Hypertension Mother   . Cancer - Lung Mother   . Hypertension Sister   . Diabetes Sister   . Seizures Sister   . Thyroid disease Sister   . Heart disease Sister        stents  . Asthma Sister   . Hypertension Brother     Social History   Tobacco Use  . Smoking status: Former Research scientist (life sciences)  . Smokeless tobacco: Never Used  Substance Use Topics  . Alcohol use: Yes    Comment: occ./none with preg; stopped when preg  . Drug use: Yes    Types: Marijuana    Comment: daily; stopped once  found out preg    Home Medications Prior to Admission medications   Medication Sig Start Date End Date Taking? Authorizing Provider  acetaminophen-codeine (TYLENOL #3) 300-30 MG tablet Take 1 tablet by mouth every 8 (eight) hours as needed for moderate pain. 01/21/21  Yes Magdalynn Davilla A, PA-C  ondansetron (ZOFRAN ODT) 4 MG disintegrating tablet Take 1 tablet (4 mg total) by mouth every 8 (eight) hours as needed. 01/21/21  Yes Deboraha Goar A, PA-C  amLODipine (NORVASC) 5 MG tablet TAKE 1 TABLET (5 MG TOTAL) BY MOUTH DAILY. 06/18/20   Kerin Perna, NP  Ascorbic Acid (VITAMIN C) 1000 MG tablet Take 500 mg by mouth daily.    [provider]  benzonatate (TESSALON) 100 MG capsule Take 1 capsule by mouth every 8 (eight) hours for cough. 12/22/20   Vanessa Kick, MD  Blood Pressure Monitor KIT 1 kit by Does not apply route 3 (three) times daily. 11/27/19   Kerin Perna, NP   Cholecalciferol (VITAMIN D3 PO) Take by mouth daily.    [provider]  hydrochlorothiazide (HYDRODIURIL) 12.5 MG tablet TAKE 1 TABLET (12.5 MG TOTAL) BY MOUTH DAILY. 08/23/20   Kerin Perna, NP  metroNIDAZOLE (FLAGYL) 500 MG tablet Take 1 tablet (500 mg total) by mouth 2 (two) times daily. 12/09/19   Kerin Perna, NP  Multiple Vitamin (MULTIVITAMIN) tablet Take 1 tablet by mouth daily.    [provider]    Allergies    Patient has no known allergies.  Review of Systems   Review of Systems  Constitutional: Negative for activity change, chills and fever.  HENT: Negative for congestion and sore throat.   Eyes: Negative for visual disturbance.  Respiratory: Negative for shortness of breath.   Cardiovascular: Negative for chest pain.  Gastrointestinal: Positive for abdominal pain. Negative for blood in stool, diarrhea, nausea and vomiting.  Genitourinary: Positive for pelvic pain and vaginal bleeding. Negative for dysuria, flank pain, frequency, hematuria and urgency.  Musculoskeletal: Negative for back pain, myalgias, neck pain and neck stiffness.  Skin: Negative for rash and wound.  Allergic/Immunologic: Negative for immunocompromised state.  Neurological: Negative for dizziness, seizures, syncope, weakness, numbness and headaches.  Psychiatric/Behavioral: Negative for confusion.    Physical Exam Updated Vital Signs BP (!) 158/98   Pulse 68   Temp 98 F (36.7 C)   Resp 16   Ht 5' 2"  (1.575 m)   LMP 01/20/2021 Comment: negative beta HCG 01/20/21  SpO2 100%   BMI 27.25 kg/m   Physical Exam Vitals and nursing note reviewed.  Constitutional:      General: She is not in acute distress.    Comments: Uncomfortable appearing  HENT:     Head: Normocephalic.  Eyes:     Conjunctiva/sclera: Conjunctivae normal.  Cardiovascular:     Rate and Rhythm: Normal rate and regular rhythm.     Heart sounds: No murmur heard. No friction rub. No gallop.    Pulmonary:     Effort: Pulmonary effort is normal. No respiratory distress.  Abdominal:     General: There is no distension.     Palpations: Abdomen is soft.     Tenderness: There is abdominal tenderness.     Comments: Tender palpation in the right pelvic region.  There is guarding, but no rebound.  Pain does extend up into the right lower quadrant, but she is not focally tender over McBurney's point.  No CVA tenderness bilaterally.  Negative Murphy sign.  Normoactive bowel sounds.  Musculoskeletal:     Cervical back: Neck supple.  Skin:    General: Skin is warm.     Findings: No rash.  Neurological:     Mental Status: She is alert.  Psychiatric:        Behavior: Behavior normal.     ED Results / Procedures / Treatments   Labs (all labs ordered are listed, but only abnormal results are displayed) Labs Reviewed  CBC WITH DIFFERENTIAL/PLATELET - Abnormal; Notable for the following components:      Result Value   Hemoglobin 10.2 (*)    HCT 33.2 (*)    MCV 72.2 (*)    MCH 22.2 (*)    RDW 18.5 (*)    Eosinophils Absolute 0.6 (*)    All other components within normal limits  COMPREHENSIVE METABOLIC PANEL - Abnormal; Notable for the following components:   Sodium 134 (*)    Potassium 3.3 (*)    Calcium 8.8 (*)    All other components within normal limits  URINALYSIS, ROUTINE W REFLEX MICROSCOPIC - Abnormal; Notable for the following components:   APPearance HAZY (*)    Hgb urine dipstick LARGE (*)    Protein, ur 30 (*)    RBC / HPF >50 (*)    Bacteria, UA RARE (*)    All other components within normal limits  I-STAT BETA HCG BLOOD, ED ([facility], [facility], [facility] ONLY)    EKG None  Radiology CT ABDOMEN PELVIS W CONTRAST  Result Date: 01/21/2021 CLINICAL DATA:  Right lower quadrant pain EXAM: CT ABDOMEN AND PELVIS WITH CONTRAST TECHNIQUE: Multidetector CT imaging of the abdomen and pelvis was performed using the standard protocol following bolus administration of intravenous contrast.  CONTRAST:  175m OMNIPAQUE IOHEXOL 300 MG/ML  SOLN COMPARISON:  None. FINDINGS: Lower chest: The visualized heart size within normal limits. No pericardial fluid/thickening. No hiatal hernia. The visualized portions of the lungs are clear. Hepatobiliary: There is diffuse low density seen throughout the liver parenchyma. Main portal vein is patent. No evidence of calcified gallstones, gallbladder wall thickening or biliary dilatation. Pancreas: Unremarkable. No pancreatic ductal dilatation or surrounding inflammatory changes. Spleen: Normal in size without focal abnormality. Adrenals/Urinary Tract: Both adrenal glands appear normal. The kidneys and collecting system appear normal without evidence of urinary tract calculus or hydronephrosis. Bladder is unremarkable. Stomach/Bowel: The stomach, small bowel, and colon are normal in appearance. No inflammatory changes, wall thickening, or obstructive findings.The appendix is normal. Vascular/Lymphatic: There are no enlarged mesenteric, retroperitoneal, or pelvic lymph nodes. No significant vascular findings are present. Reproductive: There is heterogeneous appearance to the uterus with probable small fibroids and heterogeneously thickened endometrium. A small amount of fluid seen within the endometrial canal. Surgical clip is seen within the deep pelvis and there is a clip seen within the right adnexa. Other: No evidence of abdominal wall mass or hernia. Musculoskeletal: No acute or significant osseous findings. IMPRESSION: Normal appearing appendix Heterogeneously enlarged uterus with possible small uterine fibroids. Electronically Signed   By: BPrudencio PairM.D.   On: 01/21/2021 01:44   [place]PELVIC COMPLETE W TRANSVAGINAL AND TORSION R/O  Result Date: 01/21/2021 CLINICAL DATA:  Initial evaluation for acute right pelvic pain. EXAM: TRANSABDOMINAL AND TRANSVAGINAL ULTRASOUND OF PELVIS DOPPLER ULTRASOUND OF OVARIES TECHNIQUE: Both transabdominal and transvaginal  ultrasound examinations of the pelvis were performed. Transabdominal technique was performed for global imaging of the pelvis including uterus, ovaries, adnexal regions, and pelvic cul-de-sac. It was necessary to proceed with endovaginal exam following the transabdominal  exam to visualize the uterus, endometrium, and ovaries. Color and duplex Doppler ultrasound was utilized to evaluate blood flow to the ovaries. COMPARISON:  None available. FINDINGS: Uterus Measurements: 10.5 x 5.6 x 7.5 cm = volume: 232.5 mL. Uterus is anteverted. 1.9 x 1.1 x 1.5 cm intramural fibroid present at the right posterior uterine body. Few nabothian cysts noted about the cervix. Endometrium Thickness: 11 mm.  No focal abnormality visualized. Right ovary Measurements: 4.6 x 3.0 x 2.4 cm = volume: 17.5 mL. 1.9 x 1.6 x 1.6 cm mildly complex cyst with internal lace-like architecture and peripherally increased vascularity, which could reflect a degenerating corpus luteal cyst or possibly hemorrhagic cyst. Left ovary Measurements: 3.1 x 3.4 x 3.1 cm = volume: 16.9 mL. Normal appearance/no adnexal mass. Pulsed Doppler evaluation of both ovaries demonstrates normal low-resistance arterial and venous waveforms. Other findings No abnormal free fluid. IMPRESSION: 1. 1.9 cm mildly complex right ovarian cyst, which could reflect a degenerating corpus luteal cyst or possibly hemorrhagic cyst. Finding could contribute to right-sided pelvic pain. 2. No evidence for ovarian torsion. 3. 1.9 cm intramural fibroid at the right posterior uterine body. Electronically Signed   By: Jeannine Boga M.D.   On: 01/21/2021 00:40    Procedures Procedures   Medications Ordered in ED Medications  morphine 4 MG/ML injection 4 mg (4 mg Intravenous Given 01/20/21 2323)  ondansetron (ZOFRAN) injection 4 mg (4 mg Intravenous Given 01/20/21 2323)  iohexol (OMNIPAQUE) 300 MG/ML solution 100 mL (100 mLs Intravenous Contrast Given 01/21/21 0119)  ketorolac (TORADOL)  30 MG/ML injection 30 mg (30 mg Intravenous Given 01/21/21 0222)    ED Course  I have reviewed the triage vital signs and the nursing notes.  Pertinent labs & imaging results that were available during my care of the patient were reviewed by me and considered in my medical decision making (see chart for details).    MDM Rules/Calculators/A&P                          40 year old female with a history of fatigue, anemia who presents to the emergency department with a chief complaint of right-sided pelvic pain that began suddenly earlier tonight.  No other associated symptoms.  Vital signs are stable.  On exam, she has voluntary guarding with palpation in the right pelvic region.  Tenderness does extend up into the right lower quadrant, she is not focally tender over McBurney's point.  No rebound.  I considered the following causes of right-sided pelvic pain including ovarian torsion, ruptured ovarian cyst, PID, UTI, obstructive uropathy, appendicitis, ectopic pregnancy, spontaneous abortion.  Labs and imaging were reviewed and independently interpreted by me.  Initially, this was suspicious for ovarian torsion.  Ultrasound with 1.9 cm mildly complex right ovarian cyst that is suspicious for degenerating corpus luteal cyst versus hemorrhagic cyst that could be contributing to her right-sided pelvic pain.  No evidence of ovarian torsion with good flow to the ovaries.  She has no leukocytosis.  She has a microcytic anemia, but this is chronic.  No metabolic derangements.  UA is not concerning for infection.  Given continued pain with concern for possible appendicitis, CT abdomen pelvis was obtained, which demonstrated a normal-appearing appendix, but there was noted to be a small amount of fluid noted within the endometrial canal.  Suspicious for ruptured ovarian cyst versus leaking from hemorrhagic right ovarian cyst that was noted on pelvic ultrasound.  On reevaluation, patient's pain is  improving.  Will discharge patient home with pain control and follow-up to OB/GYN.  She is hemodynamically stable no acute distress.  Safer discharge home with outpatient follow-up as indicated.   Final Clinical Impression(s) / ED Diagnoses Final diagnoses:  Right ovarian cyst  Ruptured cyst of ovary    Rx / DC Orders ED Discharge Orders         Ordered    oxyCODONE-acetaminophen (PERCOCET/ROXICET) 5-325 MG tablet  Every 8 hours PRN,   Status:  Discontinued        01/21/21 0214    ondansetron (ZOFRAN ODT) 4 MG disintegrating tablet  Every 8 hours PRN        01/21/21 0214    acetaminophen-codeine (TYLENOL #3) 300-30 MG tablet  Every 8 hours PRN        01/21/21 0225           Britain Anagnos A, PA-C 01/21/21 [id number]    Lacretia Leigh, MD 01/26/21 1013

## 2021-01-24 ENCOUNTER — Telehealth: Payer: Self-pay | Admitting: *Deleted

## 2021-01-24 NOTE — Telephone Encounter (Signed)
Transition Care Management Unsuccessful Follow-up Telephone Call  Date of discharge and from where:  01-21-2021 [facility]  Attempts:  1st attempt  Reason for unsuccessful TCM follow-up call: no answer/ no voicemail

## 2021-01-25 ENCOUNTER — Telehealth: Payer: Self-pay | Admitting: *Deleted

## 2021-01-25 NOTE — Telephone Encounter (Signed)
Transition Care Management Unsuccessful Follow-up Telephone Call  Date of discharge and from where:  01/21/[facility]  Attempts:  2nd Attempt  Reason for unsuccessful TCM follow-up call:  Unable to leave message

## 2021-01-26 NOTE — Telephone Encounter (Signed)
Transition Care Management Unsuccessful Follow-up Telephone Call  Date of discharge and from where:  01/21/2021 [facility]  Attempts:  3rd Attempt  Reason for unsuccessful TCM follow-up call:  Unable to leave message - no VM

## 2021-03-18 ENCOUNTER — Other Ambulatory Visit: Payer: Self-pay

## 2021-03-18 ENCOUNTER — Ambulatory Visit (INDEPENDENT_AMBULATORY_CARE_PROVIDER_SITE_OTHER): Payer: Medicaid Other | Admitting: Primary Care

## 2021-03-18 ENCOUNTER — Encounter (INDEPENDENT_AMBULATORY_CARE_PROVIDER_SITE_OTHER): Payer: Self-pay | Admitting: Primary Care

## 2021-03-18 VITALS — BP 132/84 | HR 78 | Resp 16 | Wt 151.4 lb

## 2021-03-18 DIAGNOSIS — I10 Essential (primary) hypertension: Secondary | ICD-10-CM

## 2021-03-18 DIAGNOSIS — D509 Iron deficiency anemia, unspecified: Secondary | ICD-10-CM | POA: Diagnosis not present

## 2021-03-18 MED ORDER — HYDROCHLOROTHIAZIDE 12.5 MG PO TABS: 12.5000 mg | ORAL_TABLET | Freq: Every day | ORAL | 0 refills | Status: DC

## 2021-03-18 MED ORDER — AMLODIPINE BESYLATE 5 MG PO TABS: 1.0000 | ORAL_TABLET | Freq: Every day | ORAL | 0 refills | Status: DC

## 2021-03-18 MED ORDER — FERROUS SULFATE 325 (65 FE) MG PO TABS
325.0000 mg | ORAL_TABLET | Freq: Every day | ORAL | 3 refills | Status: DC
Start: 2021-03-18 — End: 2021-12-28

## 2021-03-18 NOTE — Patient Instructions (Addendum)

## 2021-03-18 NOTE — Progress Notes (Signed)
  Subjective:    Patient here for follow-up of elevated blood pressure.  She is exercising and is adherent to a low-salt diet.  Blood pressure is well controlled at home. Cardiac symptoms: none. Patient denies: chest pain, dyspnea, irregular heart beat, palpitations and syncope. Cardiovascular risk factors: none. Use of agents associated with hypertension: none. History of target organ damage: none.    Review of Systems Review of Systems  Constitutional: Positive for malaise/fatigue.  All other systems reviewed and are negative.    Objective:  BP 132/84   Pulse 78   Resp 16   Wt 151 lb 6.4 oz (68.7 kg)   SpO2 99%   BMI 27.69 kg/m   Assessment:    Hypertension, stage 1 . Evidence of target organ damage: none.    Plan:  [place] was seen today for hypertension.  Diagnoses and all orders for this visit:  Iron deficiency anemia, unspecified iron deficiency anemia type -     ferrous sulfate 325 (65 FE) MG tablet; Take 1 tablet (325 mg total) by mouth daily with breakfast.  Hypertension, unspecified type Counseled on blood pressure goal of less than 130/80, low-sodium, DASH diet, medication compliance, 150 minutes of moderate intensity exercise per week. Discussed medication compliance, adverse effects. -     amLODipine (NORVASC) 5 MG tablet; Take 1 tablet (5 mg total) by mouth daily. -     hydrochlorothiazide (HYDRODIURIL) 12.5 MG tablet; Take 1 tablet (12.5 mg total) by mouth daily.  Other orders -     ferrous sulfate 325 (65 FE) MG tablet; Take 1 tablet (325 mg total) by mouth daily with breakfast.  Grayce Sessions

## 2021-05-27 ENCOUNTER — Other Ambulatory Visit (INDEPENDENT_AMBULATORY_CARE_PROVIDER_SITE_OTHER): Payer: Self-pay | Admitting: Primary Care

## 2021-05-27 DIAGNOSIS — I10 Essential (primary) hypertension: Secondary | ICD-10-CM

## 2021-06-17 ENCOUNTER — Ambulatory Visit (INDEPENDENT_AMBULATORY_CARE_PROVIDER_SITE_OTHER): Payer: Medicaid Other | Admitting: Primary Care

## 2021-09-06 ENCOUNTER — Other Ambulatory Visit (INDEPENDENT_AMBULATORY_CARE_PROVIDER_SITE_OTHER): Payer: Self-pay | Admitting: Primary Care

## 2021-09-06 DIAGNOSIS — I10 Essential (primary) hypertension: Secondary | ICD-10-CM

## 2021-09-06 NOTE — Telephone Encounter (Signed)
Patient will need an office visit for further refills. Requested Prescriptions  Pending Prescriptions Disp Refills  . amLODipine (NORVASC) 5 MG tablet [Pharmacy Med Name: AMLODIPINE BESYLATE 5 MG ORAL TABLET] 30 tablet 0    Sig: TAKE 1 TABLET (5 MG TOTAL) BY MOUTH DAILY.     Cardiovascular:  Calcium Channel Blockers Passed - 09/06/2021  5:52 PM      Passed - Last BP in normal range    BP Readings from Last 1 Encounters:  03/18/21 132/84         Passed - Valid encounter within last 6 months    Recent Outpatient Visits          5 months ago Iron deficiency anemia, unspecified iron deficiency anemia type   [facility] Grayce Sessions, NP   1 year ago Cervical cancer screening   [facility] Grayce Sessions, NP   1 year ago Chronic left shoulder pain   [facility] Grayce Sessions, NP   3 years ago Hypertension, unspecified type   [facility] Loletta Specter, PA-C

## 2021-09-19 ENCOUNTER — Ambulatory Visit (INDEPENDENT_AMBULATORY_CARE_PROVIDER_SITE_OTHER): Payer: Self-pay | Admitting: *Deleted

## 2021-09-19 NOTE — Telephone Encounter (Signed)
FYI

## 2021-09-19 NOTE — Telephone Encounter (Signed)
Reason for Disposition  [1] Dizziness, lightheadedness, or weakness AND [2] heart beating very rapidly (e.g., > 140 / minute)  Answer Assessment - Initial Assessment Questions 1. DESCRIPTION: "Please describe your heart rate or heartbeat that you are having" (e.g., fast/slow, regular/irregular, skipped or extra beats, "palpitations")     Pt calling in.   For the last 2 hrs my heart is beating so fast.   I'm laying under the fan because I'm feeling so hot.  No chest pain.   I feel I'm out of breath. 2. ONSET: "When did it start?" (Minutes, hours or days)      2 hours ago. 3. DURATION: "How long does it last" (e.g., seconds, minutes, hours)     My heart is beating real fast right now.   I'm trying to breath slower.    I've not been feeling good today.   I've been laying down today. I started getting real hot and my heart racing.    It happened with my son my heart would race like this.    4. PATTERN "Does it come and go, or has it been constant since it started?"  "Does it get worse with exertion?"   "Are you feeling it now?"     2 hours now and it has not stopped.   You can see the heartbeat in my neck.    5. TAP: "Using your hand, can you tap out what you are feeling on a chair or table in front of you, so that I can hear?" (Note: not all patients can do this)       *No Answer* 6. HEART RATE: "Can you tell me your heart rate?" "How many beats in 15 seconds?"  (Note: not all patients can do this)       She is going to check her BP.   I feel so out of breath right now.   She checked her BP while I waited and it was 161/126 pulse 187.   I instructed her to call 911 which she was agreeable to doing. 7. RECURRENT SYMPTOM: "Have you ever had this before?" If Yes, ask: "When was the last time?" and "What happened that time?"      Yes when I was pregnant with my son.    They didn't know what caused it.   They would tell me to calm down. 8. CAUSE: "What do you think is causing the palpitations?"      Didn't get a chance to ask before instructing her to call 911. 9. CARDIAC HISTORY: "Do you have any history of heart disease?" (e.g., heart attack, angina, bypass surgery, angioplasty, arrhythmia)      None I know of 10. OTHER SYMPTOMS: "Do you have any other symptoms?" (e.g., dizziness, chest pain, sweating, difficulty breathing)       Denies chest pain, but is sweating.   "I'm sitting in front of my fan". 11. PREGNANCY: "Is there any chance you are pregnant?" "When was your last menstrual period?"       Not asked  Protocols used: Heart Rate and Heartbeat Questions-A-AH

## 2021-09-19 NOTE — Telephone Encounter (Signed)
Pt called in c/o her heart beating real fast, sweating and being very hot, (sitting in front of a fan) and being out of breath.   Denied chest pain.    I asked if she had a BP cuff and could check her BP.   She did.    It was 161/126 with a pulse of 187.   She kept mentioning being out of breath.   I instructed her to call 911 now that she needed to go to the hospital.    She was agreeable to calling.    "I need someone to come get my kids".   (I could hear them in the background).   I asked if she could call 911 or needed help she said she would call and thanked me for my help.  I forwarded my notes to [facility] for Gwinda Passe, NP.

## 2021-12-09 ENCOUNTER — Ambulatory Visit (INDEPENDENT_AMBULATORY_CARE_PROVIDER_SITE_OTHER): Payer: Self-pay

## 2021-12-09 NOTE — Telephone Encounter (Addendum)
Patient called, unable to leave VM d/t mailbox not set up.   Summary: blood pressure, cough   Pt calling in wanting to speak to pcp about blood pressure and a cough she's having, no appt avail until 2/2. Seeking clnical advice

## 2021-12-09 NOTE — Telephone Encounter (Signed)
3rd attempt, pt call, unable to leave VM so routing to provider per protocol.

## 2021-12-09 NOTE — Telephone Encounter (Signed)
Patient called, no answer, voicemail box not set up.   Summary: blood pressure, cough   Pt calling in wanting to speak to pcp about blood pressure and a cough she's having, no appt avail until 2/2. Seeking clnical advice

## 2021-12-12 ENCOUNTER — Ambulatory Visit (INDEPENDENT_AMBULATORY_CARE_PROVIDER_SITE_OTHER): Payer: Medicaid Other

## 2021-12-14 NOTE — Telephone Encounter (Signed)
Attempted to reach patient she does not have voicemail set up. She needs to schedule an appt with PCP.

## 2021-12-28 ENCOUNTER — Other Ambulatory Visit: Payer: Self-pay

## 2021-12-28 ENCOUNTER — Encounter (INDEPENDENT_AMBULATORY_CARE_PROVIDER_SITE_OTHER): Payer: Self-pay | Admitting: Primary Care

## 2021-12-28 ENCOUNTER — Ambulatory Visit (INDEPENDENT_AMBULATORY_CARE_PROVIDER_SITE_OTHER): Payer: Medicaid Other | Admitting: Primary Care

## 2021-12-28 VITALS — BP 130/92 | HR 82 | Temp 97.9°F | Ht 63.0 in | Wt 148.6 lb

## 2021-12-28 DIAGNOSIS — L309 Dermatitis, unspecified: Secondary | ICD-10-CM

## 2021-12-28 DIAGNOSIS — D509 Iron deficiency anemia, unspecified: Secondary | ICD-10-CM | POA: Diagnosis not present

## 2021-12-28 DIAGNOSIS — I10 Essential (primary) hypertension: Secondary | ICD-10-CM

## 2021-12-28 MED ORDER — AMLODIPINE BESYLATE 5 MG PO TABS: 5.0000 mg | ORAL_TABLET | Freq: Every day | ORAL | 1 refills | Status: DC

## 2021-12-28 MED ORDER — TRIAMCINOLONE ACETONIDE 0.1 % EX CREA: 1.0000 "application " | TOPICAL_CREAM | Freq: Two times a day (BID) | CUTANEOUS | 1 refills | Status: DC

## 2021-12-28 NOTE — Patient Instructions (Signed)
Eczema Eczema refers to a group of skin conditions that cause skin to become rough and inflamed. Each type of eczema has different triggers, symptoms, and treatments. Eczema of any type is usually itchy. Symptoms range from mild to severe. Eczema is not spread from person to person (is not contagious). It can appear on different parts of the body at different times. One person's eczema may look different from another person's eczema. What are the causes? The exact cause of this condition is not known. However, exposure to certain environmental factors, irritants, and allergens can make the condition worse. What are the signs or symptoms? Symptoms of this condition depend on the type of eczema you have. The types include: Contact dermatitis. There are two kinds: Irritant contact dermatitis. This happens when something irritates the skin and causes a rash. Allergic contact dermatitis. This happens when your skin comes in contact with something you are allergic to (allergens). This can include poison ivy, chemicals, or medicines that were applied to your skin. Atopic dermatitis. This is a long-term (chronic) skin disease that keeps coming back (recurring). It is the most common type of eczema. Usual symptoms are a red rash and itchy, dry, scaly skin. It usually starts showing signs in infancy and can last through adulthood. Dyshidrotic eczema. This is a form of eczema on the hands and feet. It shows up as very itchy, fluid-filled blisters. It can affect people of any age but is more common before age 40. Hand eczema. This causes very itchy areas of skin on the palms and sides of the hands and fingers. This type of eczema is common in industrial jobs where you may be exposed to different types of irritants. Lichen simplex chronicus. This type of eczema occurs when a person constantly scratches one area of the body. Repeated scratching of the area leads to thickened skin (lichenification). This condition can  accompany other types of eczema. It is more common in adults but may also be seen in children. Nummular eczema. This is a common type of eczema that most often affects the lower legs and the backs of the hands. It typically causes an itchy, red, circular, crusty lesion (plaque). Scratching may become a habit and can cause bleeding. Nummular eczema occurs most often in middle-aged or older people. Seborrheic dermatitis. This is a common skin disease that mainly affects the scalp. It may also affect other oily areas of the body, such as the face, sides of the nose, eyebrows, ears, eyelids, and chest. It is marked by small scaling and redness of the skin (erythema). This can affect people of all ages. In infants, this condition is called cradle cap. Stasis dermatitis. This is a common skin disease that can cause itching, scaling, and hyperpigmentation, usually on the legs and feet. It occurs most often in people who have a condition that prevents blood from being pumped through the veins in the legs (chronic venous insufficiency). Stasis dermatitis is a chronic condition that needs long-term management. How is this diagnosed? This condition may be diagnosed based on: A physical exam of your skin. Your medical history. Skin patch tests. These tests involve using patches that contain possible allergens and placing them on your back. Your health care provider will check in a few days to see if an allergic reaction occurred. How is this treated? Treatment for eczema is based on the type of eczema you have. You may be given hydrocortisone steroid medicine or antihistamines. These can relieve itching quickly and help reduce inflammation.   These may be prescribed or purchased over the counter, depending on the strength that is needed. Follow these instructions at home: Take or apply over-the-counter and prescription medicines only as told by your health care provider. Use creams or ointments to moisturize your  skin. Do not use lotions. Learn what triggers or irritates your symptoms so you can avoid these things. Treat symptom flare-ups quickly. Do not scratch your skin. This can make your rash worse. Keep all follow-up visits. This is important. Where to find more information American Academy of Dermatology: aad.org National Eczema Association: nationaleczema.org The Society for Pediatric Dermatology: pedsderm.net Contact a health care provider if: You have severe itching, even with treatment. You scratch your skin regularly until it bleeds. Your rash looks different than usual. Your skin is painful, swollen, or more red than usual. You have a fever. Summary Eczema refers to a group of skin conditions that cause skin to become rough and inflamed. Each type has different triggers. Eczema of any type causes itching that may range from mild to severe. Treatment varies based on the type of eczema you have. Hydrocortisone steroid medicine or antihistamines can help with itching and inflammation. Protecting your skin is the best way to prevent eczema. Use creams or ointments to moisturize your skin. Avoid triggers and irritants. Treat flare-ups quickly. This information is not intended to replace advice given to you by your health care provider. Make sure you discuss any questions you have with your health care provider. Document Revised: 08/16/2020 Document Reviewed: 08/16/2020 Elsevier Patient Education  2022 Elsevier Inc.  

## 2021-12-28 NOTE — Progress Notes (Signed)
She has been having headaches believed to be attributed to her blood pressure Wants to know if she can get an Rx for dry skin

## 2021-12-28 NOTE — Progress Notes (Signed)
Morgan Lee, is a 41 y.o. female  ZMO:[id number]  KPT:[id number]  DOB - [birth year]  Chief Complaint  Patient presents with   Medication Refill       Subjective:   Morgan Lee is a 41 y.o. female here today for a follow up visit. Patient has No headache, No chest pain, No abdominal pain - No Nausea, No new weakness tingling or numbness, No Cough - SOB.  No problems updated.  ALLERGIES: No Known Allergies  PAST MEDICAL HISTORY: Past Medical History:  Diagnosis Date   [redacted] weeks gestation of pregnancy    Advanced maternal age in multigravida, second trimester    Depression    Fatigue    Headaches due to old head injury    Palpitation    SOB (shortness of breath)    Syncope     MEDICATIONS AT HOME: Prior to Admission medications   Medication Sig Start Date End Date Taking? Authorizing Provider  triamcinolone cream (KENALOG) 0.1 % Apply 1 application topically 2 (two) times daily. 12/28/21  Yes Kerin Perna, NP  amLODipine (NORVASC) 5 MG tablet Take 1 tablet (5 mg total) by mouth daily. 12/28/21   Kerin Perna, NP  Ascorbic Acid (VITAMIN C) 1000 MG tablet Take 500 mg by mouth daily. Patient not taking: Reported on 12/28/2021    [provider]  Blood Pressure Monitor KIT 1 kit by Does not apply route 3 (three) times daily. Patient not taking: Reported on 12/28/2021 11/27/19   Kerin Perna, NP  Cholecalciferol (VITAMIN D3 PO) Take by mouth daily. Patient not taking: Reported on 12/28/2021    [provider]  hydrochlorothiazide (HYDRODIURIL) 12.5 MG tablet Take 1 tablet (12.5 mg total) by mouth daily. Patient not taking: Reported on 12/28/2021 03/18/21   Kerin Perna, NP  Multiple Vitamin (MULTIVITAMIN) tablet Take 1 tablet by mouth daily. Patient not taking: Reported on 12/28/2021    [provider]    Objective:   Vitals:   12/28/21 1605  BP: (!) 130/92  Pulse: 82  Temp: 97.9 F (36.6 C)   TempSrc: Oral  SpO2: 96%  Weight: 148 lb 9.6 oz (67.4 kg)  Height: 5' 3"  (1.6 m)   Exam General appearance : Awake, alert, not in any distress. Speech Clear. Not toxic looking HEENT: Atraumatic and Normocephalic, pupils equally reactive to light and accomodation Neck: Supple, no JVD. No cervical lymphadenopathy.  Chest: Good air entry bilaterally, no added sounds  CVS: S1 S2 regular, no murmurs.  Abdomen: Bowel sounds present, Non tender and not distended with no gaurding, rigidity or rebound. Extremities: B/L Lower Ext shows no edema, both legs are warm to touch Neurology: Awake alert, and oriented X 3, CN II-XII intact, Non focal Skin: No Rash  Data Review No results found for: HGBA1C  Assessment & Plan   1. Hypertension, unspecified type Counseled on blood pressure goal of less than 130/80, low-sodium, DASH diet, medication compliance, 150 minutes of moderate intensity exercise per week. Discussed medication compliance, adverse effects.  - amLODipine (NORVASC) 5 MG tablet; Take 1 tablet (5 mg total) by mouth daily.  Dispense: 90 tablet; Refill: 1 - [id number]+EGFR  2. Iron deficiency anemia, unspecified iron deficiency anemia type Non compliant with iron supplement irritates her stomach  - CBC with Differential  3. Dermatitis/eczema  - triamcinolone cream (KENALOG) 0.1 %; Apply 1 application topically 2 (two) times daily.  Dispense: 454 g; Refill: 1    Patient have been counseled  extensively about nutrition and exercise. Other issues discussed during this visit include: low cholesterol diet, weight control and daily exercise, foot care, annual eye examinations at Ophthalmology, importance of adherence with medications and regular follow-up. We also discussed long term complications of uncontrolled diabetes and hypertension.   Return for pap schedule.  The patient was given clear instructions to go to ER or return to medical center if symptoms don't improve, worsen or new  problems develop. The patient verbalized understanding. The patient was told to call to get lab results if they haven't heard anything in the next week.   This note has been created with Surveyor, quantity. Any transcriptional errors are unintentional.   Kerin Perna, NP 12/28/2021, 5:18 PM

## 2021-12-30 ENCOUNTER — Telehealth (INDEPENDENT_AMBULATORY_CARE_PROVIDER_SITE_OTHER): Payer: Self-pay | Admitting: Primary Care

## 2021-12-30 DIAGNOSIS — I10 Essential (primary) hypertension: Secondary | ICD-10-CM

## 2021-12-30 LAB — CBC WITH DIFFERENTIAL/PLATELET
Basophils Absolute: 0 10*3/uL (ref 0.0–0.2)
Basos: 0 %
EOS (ABSOLUTE): 0.3 10*3/uL (ref 0.0–0.4)
Eos: 7 %
Hematocrit: 32.1 % — ABNORMAL LOW (ref 34.0–46.6)
Hemoglobin: 9.9 g/dL — ABNORMAL LOW (ref 11.1–15.9)
Immature Grans (Abs): 0 10*3/uL (ref 0.0–0.1)
Immature Granulocytes: 0 %
Lymphocytes Absolute: 1.2 10*3/uL (ref 0.7–3.1)
Lymphs: 29 %
MCH: 21.6 pg — ABNORMAL LOW (ref 26.6–33.0)
MCHC: 30.8 g/dL — ABNORMAL LOW (ref 31.5–35.7)
MCV: 70 fL — ABNORMAL LOW (ref 79–97)
Monocytes Absolute: 0.5 10*3/uL (ref 0.1–0.9)
Monocytes: 11 %
Neutrophils Absolute: 2.1 10*3/uL (ref 1.4–7.0)
Neutrophils: 53 %
Platelets: 280 10*3/uL (ref 150–450)
RBC: 4.59 x10E6/uL (ref 3.77–5.28)
RDW: 16.3 % — ABNORMAL HIGH (ref 11.7–15.4)
WBC: 4.1 10*3/uL (ref 3.4–10.8)

## 2021-12-30 LAB — CMP14+EGFR
ALT: 24 IU/L (ref 0–32)
AST: 34 IU/L (ref 0–40)
Albumin/Globulin Ratio: 1.4 (ref 1.2–2.2)
Albumin: 4.4 g/dL (ref 3.8–4.8)
Alkaline Phosphatase: 87 IU/L (ref 44–121)
BUN/Creatinine Ratio: 16 (ref 9–23)
BUN: 14 mg/dL (ref 6–24)
Bilirubin Total: <0.2 mg/dL (ref 0.0–1.2)
CO2: 21 mmol/L (ref 20–29)
Calcium: 9.3 mg/dL (ref 8.7–10.2)
Chloride: 100 mmol/L (ref 96–106)
Creatinine, Ser: 0.86 mg/dL (ref 0.57–1.00)
Globulin, Total: 3.2 g/dL (ref 1.5–4.5)
Glucose: 76 mg/dL (ref 70–99)
Potassium: 3.9 mmol/L (ref 3.5–5.2)
Sodium: 137 mmol/L (ref 134–144)
Total Protein: 7.6 g/dL (ref 6.0–8.5)
eGFR: 87 mL/min/{1.73_m2} (ref >59)

## 2021-12-30 NOTE — Telephone Encounter (Signed)
Medication: hydrochlorothiazide (HYDRODIURIL) 12.5 MG tablet [id number]   Has the patient contacted their pharmacy? YES Pharmacy advised that the doctor has not ordered the medication (Agent: If no, request that the patient contact the pharmacy for the refill. If patient does not wish to contact the pharmacy document the reason why and proceed with request.) (Agent: If yes, when and what did the pharmacy advise?)  Preferred Pharmacy (with phone number or street name): [place], [place] - [address] [place] [address] Phone: [phone] Fax: [phone] Hours: Not open 24 hours   Has the patient been seen for an appointment in the last year OR does the patient have an upcoming appointment? Yes 01/18/22  Agent: Please be advised that RX refills may take up to 3 business days. We ask that you follow-up with your pharmacy.

## 2021-12-30 NOTE — Telephone Encounter (Signed)
Requested medications are due for refill today.  unsure  Requested medications are on the active medications list.  yes  Last refill. 03/18/2021 #10 refills  Future visit scheduled.   yes  Notes to clinic.  From OV of 12/28/2021 pt is not taking this medication.    Requested Prescriptions  Pending Prescriptions Disp Refills   hydrochlorothiazide (HYDRODIURIL) 12.5 MG tablet 10 tablet 0    Sig: Take 1 tablet (12.5 mg total) by mouth daily.     Cardiovascular: Diuretics - Thiazide Failed - 12/30/2021  1:05 PM      Failed - Last BP in normal range    BP Readings from Last 1 Encounters:  12/28/21 (!) 130/92          Passed - Cr in normal range and within 180 days    Creatinine, Ser  Date Value Ref Range Status  12/28/2021 0.86 0.57 - 1.00 mg/dL Final   Creatinine, Urine  Date Value Ref Range Status  12/24/2017 194.00 mg/dL Final          Passed - K in normal range and within 180 days    Potassium  Date Value Ref Range Status  12/28/2021 3.9 3.5 - 5.2 mmol/L Final          Passed - Na in normal range and within 180 days    Sodium  Date Value Ref Range Status  12/28/2021 137 134 - 144 mmol/L Final          Passed - Valid encounter within last 6 months    Recent Outpatient Visits           2 days ago Iron deficiency anemia, unspecified iron deficiency anemia type   [facility] Grayce Sessions, NP   9 months ago Iron deficiency anemia, unspecified iron deficiency anemia type   [facility] Grayce Sessions, NP   2 years ago Cervical cancer screening   [facility] Grayce Sessions, NP   2 years ago Chronic left shoulder pain   [facility] Grayce Sessions, NP   3 years ago Hypertension, unspecified type   [facility] Loletta Specter, PA-C       Future Appointments             In 2 weeks Grayce Sessions, NP [facility]

## 2022-01-12 NOTE — Telephone Encounter (Signed)
Sent to PCP ?

## 2022-01-12 NOTE — Telephone Encounter (Signed)
Pt wanted to know why this Rx was denied, please advise.  

## 2022-01-13 ENCOUNTER — Other Ambulatory Visit (INDEPENDENT_AMBULATORY_CARE_PROVIDER_SITE_OTHER): Payer: Self-pay | Admitting: Primary Care

## 2022-01-13 DIAGNOSIS — I10 Essential (primary) hypertension: Secondary | ICD-10-CM

## 2022-01-13 MED ORDER — HYDROCHLOROTHIAZIDE 12.5 MG PO TABS
12.5000 mg | ORAL_TABLET | Freq: Every day | ORAL | 1 refills | Status: DC
Start: 2022-01-13 — End: 2022-10-24

## 2022-01-13 MED ORDER — AMLODIPINE BESYLATE 5 MG PO TABS: 5.0000 mg | ORAL_TABLET | Freq: Every day | ORAL | 1 refills | Status: DC

## 2022-01-18 ENCOUNTER — Encounter (INDEPENDENT_AMBULATORY_CARE_PROVIDER_SITE_OTHER): Payer: Self-pay | Admitting: Primary Care

## 2022-01-18 ENCOUNTER — Other Ambulatory Visit: Payer: Self-pay

## 2022-01-18 ENCOUNTER — Ambulatory Visit (INDEPENDENT_AMBULATORY_CARE_PROVIDER_SITE_OTHER): Payer: Medicaid Other | Admitting: Primary Care

## 2022-01-18 VITALS — BP 150/88 | HR 93 | Temp 98.3°F | Ht 63.0 in | Wt 154.4 lb

## 2022-01-18 DIAGNOSIS — I10 Essential (primary) hypertension: Secondary | ICD-10-CM | POA: Diagnosis not present

## 2022-01-18 DIAGNOSIS — N898 Other specified noninflammatory disorders of vagina: Secondary | ICD-10-CM | POA: Diagnosis not present

## 2022-01-18 DIAGNOSIS — Z124 Encounter for screening for malignant neoplasm of cervix: Secondary | ICD-10-CM

## 2022-01-18 NOTE — Progress Notes (Signed)
?  [facility] ? ?WELL-WOMAN PHYSICAL & PAP ?Patient name: Morgan Lee MRN [id number]  Date of birth: February 19, 1981 ?Chief Complaint:   ?Hypertension ? ?History of Present Illness:   ?Morgan Lee is a 41 y.o. [id number] female being seen today for a routine well-woman exam.  ? ? ?The current method of family planning is tubal ligation.  ?Patient's last menstrual period was 11/28/2021 (exact date). ?Last pap . Results were: normal ? ?Review of Systems:   ? ?Denies any headaches, blurred vision, fatigue, shortness of breath, chest pain, abdominal pain, abnormal vaginal discharge/itching/odor/irritation, problems with periods, bowel movements, urination, or intercourse unless otherwise stated above. ? ?Pertinent History Reviewed:  ? ?Reviewed past medical,surgical, social and family history.  ?Reviewed problem list, medications and allergies. ? ?Physical Assessment:  ? ?Vitals:  ? 01/18/22 1612 01/18/22 1645  ?BP: (!) 170/122 (!) 150/88  ?Pulse: 93   ?Temp: 98.3 ?F (36.8 ?C)   ?TempSrc: Oral   ?Weight: 154 lb 6.4 oz (70 kg)   ?Height: 5\' 3"  (1.6 m)   ?Body mass index is 27.35 kg/m?. ?  ?     Physical Examination:  ?General appearance - well appearing, and in no distress ?Mental status - alert, oriented to person, place, and time ?Psych:  She has a normal mood and affect ?Skin - warm and dry, normal color, no suspicious lesions noted ?Chest - effort normal, all lung fields clear to auscultation bilaterally ?Heart - normal rate and regular rhythm ?Neck:  midline trachea, no thyromegaly or nodules ?Breasts - breasts appear normal, no suspicious masses, no skin or nipple changes or axillary nodes ?Educated patient on proper self breast examination and had patient to demonstrate SBE. ?Abdomen - soft, nontender, nondistended, no masses or organomegaly ?Pelvic-VULVA: normal appearing vulva with no masses, tenderness or lesions   ?VAGINA: normal appearing vagina with normal color and discharge, no lesions    ?CERVIX: normal appearing cervix without discharge or lesions, no CMT ?UTERUS: uterus is felt to be normal size, shape, consistency and nontender  ?ADNEXA: No adnexal masses or tenderness noted. ?Extremities:  No swelling or varicosities noted ? ?No results found for this or any previous visit (from the past 24 hour(s)).  ? ?Assessment & Plan:  ?Morgan Lee was seen today for hypertension. ? ?Diagnoses and all orders for this visit: ? ?Cervical cancer screening ?-     Cytology - PAP() ? ?Vaginal discharge ?-     Cervicovaginal ancillary only ? ?Hypertension, unspecified type ?BP goal - < 130/80 ?Explained that having normal blood pressure is the goal and medications are helping to get to goal and maintain normal blood pressure. ?DIET: Limit salt intake, read nutrition labels to check salt content, limit fried and high fatty foods  ?Avoid using multisymptom OTC cold preparations that generally contain sudafed which can rise BP. Consult with pharmacist on best cold relief products to use for persons with HTN ?EXERCISE ?Discussed incorporating exercise such as walking - 30 minutes most days of the week and can do in 10 minute intervals     ? ? ?Follow-up: This note has been created with Gardiner Ramus. Any transcriptional errors are unintentional.  ? ?Education officer, environmental, NP ?01/22/2022, 9:52 PM  ?

## 2022-02-01 ENCOUNTER — Ambulatory Visit (INDEPENDENT_AMBULATORY_CARE_PROVIDER_SITE_OTHER): Payer: Medicaid Other | Admitting: Primary Care

## 2022-02-07 ENCOUNTER — Emergency Department (HOSPITAL_COMMUNITY)
Admission: EM | Admit: 2022-02-07 | Discharge: 2022-02-07 | Discharge status: Against Medical Advice | Payer: Medicaid Other | Attending: Emergency Medicine | Admitting: Emergency Medicine

## 2022-02-07 ENCOUNTER — Other Ambulatory Visit: Payer: Self-pay

## 2022-02-07 ENCOUNTER — Encounter (HOSPITAL_COMMUNITY): Payer: Self-pay

## 2022-02-07 DIAGNOSIS — M549 Dorsalgia, unspecified: Secondary | ICD-10-CM | POA: Diagnosis present

## 2022-02-07 DIAGNOSIS — Y92410 Unspecified street and highway as the place of occurrence of the external cause: Secondary | ICD-10-CM | POA: Insufficient documentation
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
## 2022-02-07 NOTE — ED Notes (Signed)
Pt called for room x3 with no response 

## 2022-02-23 ENCOUNTER — Ambulatory Visit (INDEPENDENT_AMBULATORY_CARE_PROVIDER_SITE_OTHER): Payer: Medicaid Other | Admitting: Primary Care

## 2022-08-14 ENCOUNTER — Ambulatory Visit (INDEPENDENT_AMBULATORY_CARE_PROVIDER_SITE_OTHER): Payer: Self-pay | Admitting: *Deleted

## 2022-08-14 NOTE — Telephone Encounter (Signed)
  Chief Complaint: swollen ankles Symptoms: swell all day, better in morning Frequency: daily Pertinent Negatives: Patient denies missing meds, takes Hydrodiurel, chart says she does not but she states she does. Disposition: [] ED /[] Urgent Care (no appt availability in office) / [] Appointment(In office/virtual)/ [x]  [facility]/ [] Home Care/ [] Refused Recommended Disposition /[] [place] [facility]/ []  Follow-up with PCP Additional Notes: I offered a MyChart UC virtual visit but pt refused. Pt would like to be worked in early Wednesday morning. It is 6pm now, I told pt I would pass that along but it would be tomorrow before she heard from anyone. Home care discussed.   Reason for Disposition  MILD or MODERATE ankle swelling (e.g., can't move joint normally, can't do usual activities) (Exceptions: Itchy, localized swelling; swelling is chronic.)  Answer Assessment - Initial Assessment Questions 1. LOCATION: "Which ankle is swollen?" "Where is the swelling?"     Both the same 2. ONSET: "When did the swelling start?"     Recently like two weeks 3. SWELLING: "How bad is the swelling?" Or, "How large is it?" (e.g., mild, moderate, severe; size of localized swelling)    - NONE: No joint swelling.   - LOCALIZED: Localized; small area of puffy or swollen skin (e.g., insect bite, skin irritation).   - MILD: Joint looks or feels mildly swollen or puffy.   - MODERATE: Swollen; interferes with normal activities (e.g., work or school); decreased range of movement; may be limping.   - SEVERE: Very swollen; can't move swollen joint at all; limping a lot or unable to walk.     Does not know why they are swelling except, not eating salt. 4. PAIN: "Is there any pain?" If Yes, ask: "How bad is it?" (Scale 1-10; or mild, moderate, severe)   - NONE (0): no pain.   - MILD (1-3): doesn't interfere with normal activities.    - MODERATE (4-7): interferes with normal activities (e.g., work or school)  or awakens from sleep, limping.    - SEVERE (8-10): excruciating pain, unable to do any normal activities, unable to walk.      Huge right now 5. CAUSE: "What do you think caused the ankle swelling?"     Not eating salt taking hydrochlorathiazide 6. OTHER SYMPTOMS: "Do you have any other symptoms?" (e.g., fever, chest pain, difficulty breathing, calf pain)     no 7. PREGNANCY: "Is there any chance you are pregnant?" "When was your last menstrual period?"     no  Protocols used: Ankle Swelling-A-AH

## 2022-08-15 NOTE — Telephone Encounter (Signed)
Returned pt call and made pt aware that we don't have any avaiable appointments. Looked at other sites and didn't see any opening slots. Made pt aware that if she is still having ankle swelling she will need to be seen at the ED.

## 2022-08-15 NOTE — Telephone Encounter (Signed)
Not ED meant urgent Care

## 2022-10-24 ENCOUNTER — Encounter (INDEPENDENT_AMBULATORY_CARE_PROVIDER_SITE_OTHER): Payer: Self-pay | Admitting: Primary Care

## 2022-10-24 ENCOUNTER — Other Ambulatory Visit (INDEPENDENT_AMBULATORY_CARE_PROVIDER_SITE_OTHER): Payer: Self-pay | Admitting: Primary Care

## 2022-10-24 ENCOUNTER — Ambulatory Visit (INDEPENDENT_AMBULATORY_CARE_PROVIDER_SITE_OTHER): Payer: Medicaid Other | Admitting: Primary Care

## 2022-10-24 VITALS — BP 144/104 | HR 100 | Resp 16 | Ht 62.0 in | Wt 156.6 lb

## 2022-10-24 DIAGNOSIS — D509 Iron deficiency anemia, unspecified: Secondary | ICD-10-CM

## 2022-10-24 DIAGNOSIS — I10 Essential (primary) hypertension: Secondary | ICD-10-CM

## 2022-10-24 DIAGNOSIS — L309 Dermatitis, unspecified: Secondary | ICD-10-CM

## 2022-10-24 DIAGNOSIS — E663 Overweight: Secondary | ICD-10-CM

## 2022-10-24 DIAGNOSIS — J029 Acute pharyngitis, unspecified: Secondary | ICD-10-CM

## 2022-10-24 MED ORDER — HYDROCHLOROTHIAZIDE 25 MG PO TABS: 25.0000 mg | ORAL_TABLET | Freq: Every day | ORAL | 1 refills | Status: DC

## 2022-10-24 MED ORDER — AMLODIPINE BESYLATE 10 MG PO TABS: 10.0000 mg | ORAL_TABLET | Freq: Every day | ORAL | 1 refills | Status: DC

## 2022-10-24 NOTE — Telephone Encounter (Signed)
Requested medications are due for refill today.  unsure  Requested medications are on the active medications list.  yes  Last refill. 12/28/2021 454g 1 rf  Future visit scheduled.   no  Notes to clinic.  Refill not delegated. Per request pt no longer using.    Requested Prescriptions  Pending Prescriptions Disp Refills   triamcinolone cream (KENALOG) 0.1 % [Pharmacy Med Name: TRIAMCINOLONE ACETONIDE 0.1 % EXTERNAL CREAM] 454 g 1    Sig: Apply 1 application topically 2 (two) times daily.     Not Delegated - Dermatology:  Corticosteroids Failed - 10/24/2022  4:00 PM      Failed - This refill cannot be delegated      Passed - Valid encounter within last 12 months    Recent Outpatient Visits           Today Iron deficiency anemia, unspecified iron deficiency anemia type   [facility] Grayce Sessions, NP   9 months ago Cervical cancer screening   [facility] Gwinda Passe P, NP   10 months ago Iron deficiency anemia, unspecified iron deficiency anemia type   [facility] Grayce Sessions, NP   1 year ago Iron deficiency anemia, unspecified iron deficiency anemia type   [facility] Grayce Sessions, NP   2 years ago Cervical cancer screening   [facility] Grayce Sessions, NP

## 2022-10-24 NOTE — Patient Instructions (Signed)
Calorie Counting for Weight Loss Calories are units of energy. Your body needs a certain number of calories from food to keep going throughout the day. When you eat or drink more calories than your body needs, your body stores the extra calories mostly as fat. When you eat or drink fewer calories than your body needs, your body burns fat to get the energy it needs. Calorie counting means keeping track of how many calories you eat and drink each day. Calorie counting can be helpful if you need to lose weight. If you eat fewer calories than your body needs, you should lose weight. Ask your health care provider what a healthy weight is for you. For calorie counting to work, you will need to eat the right number of calories each day to lose a healthy amount of weight per week. A dietitian can help you figure out how many calories you need in a day and will suggest ways to reach your calorie goal. A healthy amount of weight to lose each week is usually 1-2 lb (0.5-0.9 kg). This usually means that your daily calorie intake should be reduced by 500-750 calories. Eating 1,200-1,500 calories a day can help most women lose weight. Eating 1,500-1,800 calories a day can help most men lose weight. What do I need to know about calorie counting? Work with your health care provider or dietitian to determine how many calories you should get each day. To meet your daily calorie goal, you will need to: Find out how many calories are in each food that you would like to eat. Try to do this before you eat. Decide how much of the food you plan to eat. Keep a food log. Do this by writing down what you ate and how many calories it had. To successfully lose weight, it is important to balance calorie counting with a healthy lifestyle that includes regular activity. Where do I find calorie information?  The number of calories in a food can be found on a Nutrition Facts label. If a food does not have a Nutrition Facts label, try  to look up the calories online or ask your dietitian for help. Remember that calories are listed per serving. If you choose to have more than one serving of a food, you will have to multiply the calories per serving by the number of servings you plan to eat. For example, the label on a package of bread might say that a serving size is 1 slice and that there are 90 calories in a serving. If you eat 1 slice, you will have eaten 90 calories. If you eat 2 slices, you will have eaten 180 calories. How do I keep a food log? After each time that you eat, record the following in your food log as soon as possible: What you ate. Be sure to include toppings, sauces, and other extras on the food. How much you ate. This can be measured in cups, ounces, or number of items. How many calories were in each food and drink. The total number of calories in the food you ate. Keep your food log near you, such as in a pocket-sized notebook or on an app or website on your mobile phone. Some programs will calculate calories for you and show you how many calories you have left to meet your daily goal. What are some portion-control tips? Know how many calories are in a serving. This will help you know how many servings you can have of a certain   food. Use a measuring cup to measure serving sizes. You could also try weighing out portions on a kitchen scale. With time, you will be able to estimate serving sizes for some foods. Take time to put servings of different foods on your favorite plates or in your favorite bowls and cups so you know what a serving looks like. Try not to eat straight from a food's packaging, such as from a bag or box. Eating straight from the package makes it hard to see how much you are eating and can lead to overeating. Put the amount you would like to eat in a cup or on a plate to make sure you are eating the right portion. Use smaller plates, glasses, and bowls for smaller portions and to prevent  overeating. Try not to multitask. For example, avoid watching TV or using your computer while eating. If it is time to eat, sit down at a table and enjoy your food. This will help you recognize when you are full. It will also help you be more mindful of what and how much you are eating. What are tips for following this plan? Reading food labels Check the calorie count compared with the serving size. The serving size may be smaller than what you are used to eating. Check the source of the calories. Try to choose foods that are high in protein, fiber, and vitamins, and low in saturated fat, trans fat, and sodium. Shopping Read nutrition labels while you shop. This will help you make healthy decisions about which foods to buy. Pay attention to nutrition labels for low-fat or fat-free foods. These foods sometimes have the same number of calories or more calories than the full-fat versions. They also often have added sugar, starch, or salt to make up for flavor that was removed with the fat. Make a grocery list of lower-calorie foods and stick to it. Cooking Try to cook your favorite foods in a healthier way. For example, try baking instead of frying. Use low-fat dairy products. Meal planning Use more fruits and vegetables. One-half of your plate should be fruits and vegetables. Include lean proteins, such as chicken, turkey, and fish. Lifestyle Each week, aim to do one of the following: 150 minutes of moderate exercise, such as walking. 75 minutes of vigorous exercise, such as running. General information Know how many calories are in the foods you eat most often. This will help you calculate calorie counts faster. Find a way of tracking calories that works for you. Get creative. Try different apps or programs if writing down calories does not work for you. What foods should I eat?  Eat nutritious foods. It is better to have a nutritious, high-calorie food, such as an avocado, than a food with  few nutrients, such as a bag of potato chips. Use your calories on foods and drinks that will fill you up and will not leave you hungry soon after eating. Examples of foods that fill you up are nuts and nut butters, vegetables, lean proteins, and high-fiber foods such as whole grains. High-fiber foods are foods with more than 5 g of fiber per serving. Pay attention to calories in drinks. Low-calorie drinks include water and unsweetened drinks. The items listed above may not be a complete list of foods and beverages you can eat. Contact a dietitian for more information. What foods should I limit? Limit foods or drinks that are not good sources of vitamins, minerals, or protein or that are high in unhealthy fats. These   include: Candy. Other sweets. Sodas, specialty coffee drinks, alcohol, and juice. The items listed above may not be a complete list of foods and beverages you should avoid. Contact a dietitian for more information. How do I count calories when eating out? Pay attention to portions. Often, portions are much larger when eating out. Try these tips to keep portions smaller: Consider sharing a meal instead of getting your own. If you get your own meal, eat only half of it. Before you start eating, ask for a container and put half of your meal into it. When available, consider ordering smaller portions from the menu instead of full portions. Pay attention to your food and drink choices. Knowing the way food is cooked and what is included with the meal can help you eat fewer calories. If calories are listed on the menu, choose the lower-calorie options. Choose dishes that include vegetables, fruits, whole grains, low-fat dairy products, and lean proteins. Choose items that are boiled, broiled, grilled, or steamed. Avoid items that are buttered, battered, fried, or served with cream sauce. Items labeled as crispy are usually fried, unless stated otherwise. Choose water, low-fat milk,  unsweetened iced tea, or other drinks without added sugar. If you want an alcoholic beverage, choose a lower-calorie option, such as a glass of wine or light beer. Ask for dressings, sauces, and syrups on the side. These are usually high in calories, so you should limit the amount you eat. If you want a salad, choose a garden salad and ask for grilled meats. Avoid extra toppings such as bacon, cheese, or fried items. Ask for the dressing on the side, or ask for olive oil and vinegar or lemon to use as dressing. Estimate how many servings of a food you are given. Knowing serving sizes will help you be aware of how much food you are eating at restaurants. Where to find more information Centers for Disease Control and Prevention: http://www.wolf.info/ U.[place]. Department of Agriculture: http://www.wilson-mendoza.org/ Summary Calorie counting means keeping track of how many calories you eat and drink each day. If you eat fewer calories than your body needs, you should lose weight. A healthy amount of weight to lose per week is usually 1-2 lb (0.5-0.9 kg). This usually means reducing your daily calorie intake by 500-750 calories. The number of calories in a food can be found on a Nutrition Facts label. If a food does not have a Nutrition Facts label, try to look up the calories online or ask your dietitian for help. Use smaller plates, glasses, and bowls for smaller portions and to prevent overeating. Use your calories on foods and drinks that will fill you up and not leave you hungry shortly after a meal. This information is not intended to replace advice given to you by your health care provider. Make sure you discuss any questions you have with your health care provider. Document Revised: 12/18/2019 Document Reviewed: 12/18/2019 Elsevier Patient Education  [place]. Hypertension, Adult Hypertension is another name for high blood pressure. High blood pressure forces your heart to work harder to pump blood. This can cause  problems over time. There are two numbers in a blood pressure reading. There is a top number (systolic) over a bottom number (diastolic). It is best to have a blood pressure that is below 120/80. What are the causes? The cause of this condition is not known. Some other conditions can lead to high blood pressure. What increases the risk? Some lifestyle factors can make you more likely  to develop high blood pressure: Smoking. Not getting enough exercise or physical activity. Being overweight. Having too much fat, sugar, calories, or salt (sodium) in your diet. Drinking too much alcohol. Other risk factors include: Having any of these conditions: Heart disease. Diabetes. High cholesterol. Kidney disease. Obstructive sleep apnea. Having a family history of high blood pressure and high cholesterol. Age. The risk increases with age. Stress. What are the signs or symptoms? High blood pressure may not cause symptoms. Very high blood pressure (hypertensive crisis) may cause: Headache. Fast or uneven heartbeats (palpitations). Shortness of breath. Nosebleed. Vomiting or feeling like you may vomit (nauseous). Changes in how you see. Very bad chest pain. Feeling dizzy. Seizures. How is this treated? This condition is treated by making healthy lifestyle changes, such as: Eating healthy foods. Exercising more. Drinking less alcohol. Your doctor may prescribe medicine if lifestyle changes do not help enough and if: Your top number is above 130. Your bottom number is above 80. Your personal target blood pressure may vary. Follow these instructions at home: Eating and drinking  If told, follow the DASH eating plan. To follow this plan: Fill one half of your plate at each meal with fruits and vegetables. Fill one fourth of your plate at each meal with whole grains. Whole grains include whole-wheat pasta, Grimmer rice, and whole-grain bread. Eat or drink low-fat dairy products, such as skim  milk or low-fat yogurt. Fill one fourth of your plate at each meal with low-fat (lean) proteins. Low-fat proteins include fish, chicken without skin, eggs, beans, and tofu. Avoid fatty meat, cured and processed meat, or chicken with skin. Avoid pre-made or processed food. Limit the amount of salt in your diet to less than 1,500 mg each day. Do not drink alcohol if: Your doctor tells you not to drink. You are pregnant, may be pregnant, or are planning to become pregnant. If you drink alcohol: Limit how much you have to: 0-1 drink a day for women. 0-2 drinks a day for men. Know how much alcohol is in your drink. In the [place] one drink equals one 12 oz bottle of beer (355 mL), one 5 oz glass of wine (148 mL), or one 1 oz glass of hard liquor (44 mL). Lifestyle  Work with your doctor to stay at a healthy weight or to lose weight. Ask your doctor what the best weight is for you. Get at least 30 minutes of exercise that causes your heart to beat faster (aerobic exercise) most days of the week. This may include walking, swimming, or biking. Get at least 30 minutes of exercise that strengthens your muscles (resistance exercise) at least 3 days a week. This may include lifting weights or doing Pilates. Do not smoke or use any products that contain nicotine or tobacco. If you need help quitting, ask your doctor. Check your blood pressure at home as told by your doctor. Keep all follow-up visits. Medicines Take over-the-counter and prescription medicines only as told by your doctor. Follow directions carefully. Do not skip doses of blood pressure medicine. The medicine does not work as well if you skip doses. Skipping doses also puts you at risk for problems. Ask your doctor about side effects or reactions to medicines that you should watch for. Contact a doctor if: You think you are having a reaction to the medicine you are taking. You have headaches that keep coming back. You feel dizzy. You  have swelling in your ankles. You have trouble with your vision. Get   help right away if: You get a very bad headache. You start to feel mixed up (confused). You feel weak or numb. You feel faint. You have very bad pain in your: Chest. Belly (abdomen). You vomit more than once. You have trouble breathing. These symptoms may be an emergency. Get help right away. Call 911. Do not wait to see if the symptoms will go away. Do not drive yourself to the hospital. Summary Hypertension is another name for high blood pressure. High blood pressure forces your heart to work harder to pump blood. For most people, a normal blood pressure is less than 120/80. Making healthy choices can help lower blood pressure. If your blood pressure does not get lower with healthy choices, you may need to take medicine. This information is not intended to replace advice given to you by your health care provider. Make sure you discuss any questions you have with your health care provider. Document Revised: 08/25/2021 Document Reviewed: 08/25/2021 Elsevier Patient Education  2023 Elsevier Inc.  

## 2022-10-25 LAB — CBC WITH DIFFERENTIAL/PLATELET
Basophils Absolute: 0 10*3/uL (ref 0.0–0.2)
Basos: 0 %
EOS (ABSOLUTE): 0.5 10*3/uL — ABNORMAL HIGH (ref 0.0–0.4)
Eos: 7 %
Hematocrit: 37.1 % (ref 34.0–46.6)
Hemoglobin: 11.4 g/dL (ref 11.1–15.9)
Immature Grans (Abs): 0 10*3/uL (ref 0.0–0.1)
Immature Granulocytes: 0 %
Lymphocytes Absolute: 0.9 10*3/uL (ref 0.7–3.1)
Lymphs: 12 %
MCH: 22 pg — ABNORMAL LOW (ref 26.6–33.0)
MCHC: 30.7 g/dL — ABNORMAL LOW (ref 31.5–35.7)
MCV: 72 fL — ABNORMAL LOW (ref 79–97)
Monocytes Absolute: 0.4 10*3/uL (ref 0.1–0.9)
Monocytes: 6 %
Neutrophils Absolute: 5.8 10*3/uL (ref 1.4–7.0)
Neutrophils: 75 %
Platelets: 372 10*3/uL (ref 150–450)
RBC: 5.18 x10E6/uL (ref 3.77–5.28)
RDW: 15.8 % — ABNORMAL HIGH (ref 11.7–15.4)
WBC: 7.7 10*3/uL (ref 3.4–10.8)

## 2022-10-25 LAB — CMP14+EGFR
ALT: 27 IU/L (ref 0–32)
AST: 30 IU/L (ref 0–40)
Albumin/Globulin Ratio: 1.3 (ref 1.2–2.2)
Albumin: 4.7 g/dL (ref 3.9–4.9)
Alkaline Phosphatase: 75 IU/L (ref 44–121)
BUN/Creatinine Ratio: 26 — ABNORMAL HIGH (ref 9–23)
BUN: 18 mg/dL (ref 6–24)
Bilirubin Total: <0.2 mg/dL (ref 0.0–1.2)
CO2: 22 mmol/L (ref 20–29)
Calcium: 9.5 mg/dL (ref 8.7–10.2)
Chloride: 99 mmol/L (ref 96–106)
Creatinine, Ser: 0.68 mg/dL (ref 0.57–1.00)
Globulin, Total: 3.6 g/dL (ref 1.5–4.5)
Glucose: 89 mg/dL (ref 70–99)
Potassium: 4.3 mmol/L (ref 3.5–5.2)
Sodium: 136 mmol/L (ref 134–144)
Total Protein: 8.3 g/dL (ref 6.0–8.5)
eGFR: 111 mL/min/{1.73_m2} (ref >59)

## 2022-10-29 ENCOUNTER — Other Ambulatory Visit (INDEPENDENT_AMBULATORY_CARE_PROVIDER_SITE_OTHER): Payer: Self-pay | Admitting: Primary Care

## 2022-10-29 NOTE — Progress Notes (Signed)
[place], is a 41 y.o. female  FGH:[id number]  CVE:[id number]  DOB - February 23, 1981  Chief Complaint  Patient presents with   Oral Pain    Right lower side  Burning sensation Couple weeks        Subjective:   Morgan Lee is a 41 y.o. female here today for a follow up visit. Patient has No headache, No chest pain, No abdominal pain - No Nausea, No new weakness tingling or numbness, No Cough - shortness of breath  No problems updated.  No Known Allergies  Past Medical History:  Diagnosis Date   [redacted] weeks gestation of pregnancy    Advanced maternal age in multigravida, second trimester    Depression    Fatigue    Headaches due to old head injury    Palpitation    SOB (shortness of breath)    Syncope     Current Outpatient Medications on File Prior to Visit  Medication Sig Dispense Refill   Blood Pressure Monitor KIT 1 kit by Does not apply route 3 (three) times daily. (Patient not taking: Reported on 12/28/2021) 1 kit 0   No current facility-administered medications on file prior to visit.    Objective:   Vitals:   10/24/22 1515 10/24/22 1517  BP: (Abnormal) 144/103 (Abnormal) 144/104  Pulse: 100   Resp: 16   SpO2: 97%   Weight: 156 lb 9.6 oz (71 kg)   Height: _0  (1.575 m)     Exam General appearance : Awake, alert, not in any distress. Speech Clear. Not toxic looking HEENT: Atraumatic and Normocephalic, pupils equally reactive to light and accomodation Neck: Supple, no JVD. No cervical lymphadenopathy.  Chest: Good air entry bilaterally, no added sounds  CVS: S1 S2 regular, no murmurs.  Abdomen: Bowel sounds present, Non tender and not distended with no gaurding, rigidity or rebound. Extremities: B/L Lower Ext shows no edema, both legs are warm to touch Neurology: Awake alert, and oriented X 3, CN II-XII intact, Non focal Skin: No Rash  Data Review No results found for: "HGBA1C"  Assessment & Plan   1. Hypertension,  unspecified type .mebp - amLODipine (NORVASC) 10 MG tablet; Take 1 tablet (10 mg total) by mouth daily.  Dispense: 90 tablet; Refill: 1 - hydrochlorothiazide (HYDRODIURIL) 25 MG tablet; Take 1 tablet (25 mg total) by mouth daily.  Dispense: 90 tablet; Refill: 1 - CMP14+EGFR  2. Iron deficiency anemia, unspecified iron deficiency anemia type - CBC with Differential  3. Sore throat No swollen cervical lymphadenopathy, no tonsillar enlargement there is mild irritation redness  4. Overweight (BMI 25.0-29.9) Discussed diet and exercise for person with BMI >25. Instructed: You must burn more calories than you eat. Losing 5 percent of your body weight should be considered a success. In the longer term, losing more than 15 percent of your body weight and staying at this weight is an extremely good result. However, keep in mind that even losing 5 percent of your body weight leads to important health benefits, so try not to get discouraged if you're not able to lose more than this. Will recheck weight in 3-6 months.     Patient have been counseled extensively about nutrition and exercise. Other issues discussed during this visit include: low cholesterol diet, weight control and daily exercise, foot care, annual eye examinations at Ophthalmology, importance of adherence with medications and regular follow-up. We also discussed long term complications of uncontrolled diabetes and hypertension.   Return in  about 3 weeks (around 11/14/2022) for bp.  The patient was given clear instructions to go to ER or return to medical center if symptoms don't improve, worsen or new problems develop. The patient verbalized understanding. The patient was told to call to get lab results if they haven't heard anything in the next week.   This note has been created with Surveyor, quantity. Any transcriptional errors are unintentional.   Kerin Perna, NP 10/29/2022, 7:53  PM

## 2022-10-30 ENCOUNTER — Telehealth (INDEPENDENT_AMBULATORY_CARE_PROVIDER_SITE_OTHER): Payer: Self-pay

## 2022-10-30 NOTE — Telephone Encounter (Signed)
Will forward to provider  

## 2022-10-30 NOTE — Telephone Encounter (Signed)
Copied from CRM [id number]. Topic: General - Inquiry >> Oct 30, 2022  9:47 AM Haroldine Laws wrote: Reason for CRM: pt called asking to speak to [facility].  She wants to talk to her about her BP medication.  CB#  [phone]

## 2022-10-31 NOTE — Telephone Encounter (Signed)
Called patient concern Bp remains elevated despite taking medications. She is to call the office and leave reading to be reviewed

## 2022-11-15 ENCOUNTER — Ambulatory Visit (INDEPENDENT_AMBULATORY_CARE_PROVIDER_SITE_OTHER): Payer: Medicaid Other

## 2022-11-15 VITALS — BP 115/88

## 2022-11-15 DIAGNOSIS — I10 Essential (primary) hypertension: Secondary | ICD-10-CM

## 2023-01-17 ENCOUNTER — Other Ambulatory Visit (INDEPENDENT_AMBULATORY_CARE_PROVIDER_SITE_OTHER): Payer: Self-pay | Admitting: Primary Care

## 2023-01-17 DIAGNOSIS — L309 Dermatitis, unspecified: Secondary | ICD-10-CM

## 2023-01-18 NOTE — Telephone Encounter (Signed)
Requested medication (s) are due for refill today - provider review   Requested medication (s) are on the active medication list -yes  Future visit scheduled -no  Last refill: 10/26/22 454g 1RF  Notes to clinic: non delegated Rx  Requested Prescriptions  Pending Prescriptions Disp Refills   triamcinolone cream (KENALOG) 0.1 % [Pharmacy Med Name: TRIAMCINOLONE ACETONIDE 0.1 % EXTERNAL CREAM] 454 g 1    Sig: APPLY 1 APPLICATION TOPICALLY 2 (TWO) TIMES DAILY.     Not Delegated - Dermatology:  Corticosteroids Failed - 01/17/2023  4:44 PM      Failed - This refill cannot be delegated      Passed - Valid encounter within last 12 months    Recent Outpatient Visits           2 months ago Iron deficiency anemia, unspecified iron deficiency anemia type   [place] [facility] Kerin Perna, NP   1 year ago Cervical cancer screening   [place] [facility] Kerin Perna, NP   1 year ago Iron deficiency anemia, unspecified iron deficiency anemia type   [place] [facility] Kerin Perna, NP   1 year ago Iron deficiency anemia, unspecified iron deficiency anemia type   [place] [facility] Kerin Perna, NP   3 years ago Cervical cancer screening   [facility] Kerin Perna, NP                 Requested Prescriptions  Pending Prescriptions Disp Refills   triamcinolone cream (KENALOG) 0.1 % [Pharmacy Med Name: TRIAMCINOLONE ACETONIDE 0.1 % EXTERNAL CREAM] 454 g 1    Sig: APPLY 1 APPLICATION TOPICALLY 2 (TWO) TIMES DAILY.     Not Delegated - Dermatology:  Corticosteroids Failed - 01/17/2023  4:44 PM      Failed - This refill cannot be delegated      Passed - Valid encounter within last 12 months    Recent Outpatient Visits           2 months ago Iron deficiency anemia, unspecified iron deficiency anemia type   [place] [facility] Kerin Perna, NP   1 year ago Cervical cancer screening   [place] [facility] Kerin Perna, NP   1 year ago Iron deficiency anemia, unspecified iron deficiency anemia type    [facility] Kerin Perna, NP   1 year ago Iron deficiency anemia, unspecified iron deficiency anemia type    [facility] Kerin Perna, NP   3 years ago Cervical cancer screening    [facility] Kerin Perna, NP

## 2023-03-20 ENCOUNTER — Other Ambulatory Visit (INDEPENDENT_AMBULATORY_CARE_PROVIDER_SITE_OTHER): Payer: Self-pay | Admitting: Primary Care

## 2023-03-20 DIAGNOSIS — I10 Essential (primary) hypertension: Secondary | ICD-10-CM

## 2023-03-20 MED ORDER — AMLODIPINE BESYLATE 10 MG PO TABS
10.0000 mg | ORAL_TABLET | Freq: Every day | ORAL | 0 refills | Status: DC
Start: 2023-03-20 — End: 2023-05-25

## 2023-03-20 MED ORDER — HYDROCHLOROTHIAZIDE 25 MG PO TABS: 25.0000 mg | ORAL_TABLET | Freq: Every day | ORAL | 0 refills | Status: DC

## 2023-04-09 DIAGNOSIS — F331 Major depressive disorder, recurrent, moderate: Secondary | ICD-10-CM | POA: Diagnosis not present

## 2023-05-02 ENCOUNTER — Ambulatory Visit (INDEPENDENT_AMBULATORY_CARE_PROVIDER_SITE_OTHER): Payer: Medicaid Other | Admitting: Primary Care

## 2023-05-02 ENCOUNTER — Encounter (INDEPENDENT_AMBULATORY_CARE_PROVIDER_SITE_OTHER): Payer: Self-pay | Admitting: Primary Care

## 2023-05-02 VITALS — BP 122/87 | HR 94 | Resp 16 | Wt 151.2 lb

## 2023-05-02 DIAGNOSIS — R600 Localized edema: Secondary | ICD-10-CM | POA: Diagnosis not present

## 2023-05-02 DIAGNOSIS — R200 Anesthesia of skin: Secondary | ICD-10-CM | POA: Diagnosis not present

## 2023-05-02 DIAGNOSIS — I10 Essential (primary) hypertension: Secondary | ICD-10-CM | POA: Diagnosis not present

## 2023-05-02 NOTE — Patient Instructions (Signed)
Edema  Edema is when you have too much fluid in your body or under your skin. Edema may make your legs, feet, and ankles swell. Swelling often happens in looser tissues, such as around your eyes. This is a common condition. It gets more common as you get older. There are many possible causes of edema. These include: Eating too much salt (sodium). Being on your feet or sitting for a long time. Certain medical conditions, such as: Pregnancy. Heart failure. Liver disease. Kidney disease. Cancer. Hot weather may make edema worse. Edema is usually painless. Your skin may look swollen or shiny. Follow these instructions at home: Medicines Take over-the-counter and prescription medicines only as told by your doctor. Your doctor may prescribe a medicine to help your body get rid of extra water (diuretic). Take this medicine if you are told to take it. Eating and drinking Eat a low-salt (low-sodium) diet as told by your doctor. Sometimes, eating less salt may reduce swelling. Depending on the cause of your swelling, you may need to limit how much fluid you drink (fluid restriction). General instructions Raise the injured area above the level of your heart while you are sitting or lying down. Do not sit still or stand for a long time. Do not wear tight clothes. Do not wear garters on your upper legs. Exercise your legs. This can help the swelling go down. Wear compression stockings as told by your doctor. It is important that these are the right size. These should be prescribed by your doctor to prevent possible injuries. If elastic bandages or wraps are recommended, use them as told by your doctor. Contact a doctor if: Treatment is not working. You have heart, liver, or kidney disease and have symptoms of edema. You have sudden and unexplained weight gain. Get help right away if: You have shortness of breath or chest pain. You cannot breathe when you lie down. You have pain, redness, or  warmth in the swollen areas. You have heart, liver, or kidney disease and get edema all of a sudden. You have a fever and your symptoms get worse all of a sudden. These symptoms may be an emergency. Get help right away. Call 911. Do not wait to see if the symptoms will go away. Do not drive yourself to the hospital. Summary Edema is when you have too much fluid in your body or under your skin. Edema may make your legs, feet, and ankles swell. Swelling often happens in looser tissues, such as around your eyes. Raise the injured area above the level of your heart while you are sitting or lying down. Follow your doctor's instructions about diet and how much fluid you can drink. This information is not intended to replace advice given to you by your health care provider. Make sure you discuss any questions you have with your health care provider. Document Revised: 07/11/2021 Document Reviewed: 07/11/2021 Elsevier Patient Education  2024 Elsevier Inc.  

## 2023-05-05 NOTE — Progress Notes (Signed)
   Acute Office Visit  Subjective:     Patient ID: Morgan Lee, female    DOB: 1980/12/01, 42 y.o.   MRN: [id number]  Chief Complaint  Patient presents with   Foot Swelling    B/l    HPI [name] is a 42 year old female who has c/o bilateral feet swelling and Left arm numbness . Clarifying left arm numbness no chest pain, no shortness of breath , headaches , chest pain or radiating pain/numbness to other parts of her body. Denies any trauma. She did notice swelling in the evening asked wake up with feet swollen- no.  ROS Comprehensive ROS Pertinent positive and negative noted in HPI       Objective:    Blood Pressure 122/87 (BP Location: Right Arm, Patient Position: Sitting, Cuff Size: Normal)   Pulse 94   Respiration 16   Weight 151 lb 3.2 oz (68.6 kg)   Oxygen Saturation 100%   Body Mass Index 27.65 kg/m    Physical Exam Vitals reviewed.  Constitutional:      Appearance: Normal appearance.  HENT:     Head: Normocephalic.     Right Ear: External ear normal.     Left Ear: External ear normal.     Nose: Nose normal.  Cardiovascular:     Rate and Rhythm: Normal rate and regular rhythm.  Pulmonary:     Effort: Pulmonary effort is normal.     Breath sounds: Normal breath sounds.  Abdominal:     General: Bowel sounds are normal.     Palpations: Abdomen is soft.  Musculoskeletal:        General: Swelling present. Normal range of motion.     Cervical back: Normal range of motion.  Skin:    General: Skin is warm and dry.  Neurological:     Mental Status: She is alert and oriented to person, place, and time.  Psychiatric:        Mood and Affect: Mood normal.        Behavior: Behavior normal.        Assessment & Plan:  [name] was seen today for foot swelling.  Diagnoses and all orders for this visit:  Left arm numbness Unknown etiology - no cervical pain ++ cap refills - monitor   Hypertension, unspecified type 2/2 Localized edema Dependent  edema and increase risk of swelling in feet and ankles taking amlodipine D/w compression stockings and monitoring sodium intake      Return for pap.  Grayce Sessions, NP

## 2023-05-10 ENCOUNTER — Ambulatory Visit (INDEPENDENT_AMBULATORY_CARE_PROVIDER_SITE_OTHER): Payer: 59 | Admitting: Primary Care

## 2023-05-16 ENCOUNTER — Ambulatory Visit (INDEPENDENT_AMBULATORY_CARE_PROVIDER_SITE_OTHER): Payer: 59 | Admitting: Primary Care

## 2023-05-25 ENCOUNTER — Other Ambulatory Visit (INDEPENDENT_AMBULATORY_CARE_PROVIDER_SITE_OTHER): Payer: Self-pay | Admitting: Primary Care

## 2023-05-25 DIAGNOSIS — I10 Essential (primary) hypertension: Secondary | ICD-10-CM

## 2023-05-25 MED ORDER — AMLODIPINE BESYLATE 10 MG PO TABS: 10.0000 mg | ORAL_TABLET | Freq: Every day | ORAL | 0 refills | Status: DC

## 2023-05-25 NOTE — Telephone Encounter (Signed)
Requested medication (s) are due for refill today: yes  Requested medication (s) are on the active medication list: yes  Last refill:  03/20/23 #30  Future visit scheduled:no  Notes to clinic:  overdue lab work   Requested Prescriptions  Pending Prescriptions Disp Refills   hydrochlorothiazide (HYDRODIURIL) 25 MG tablet 90 tablet     Sig: Take 1 tablet (25 mg total) by mouth daily.     Cardiovascular: Diuretics - Thiazide Failed - 05/25/2023  3:48 PM      Failed - Cr in normal range and within 180 days    Creatinine, Ser  Date Value Ref Range Status  10/24/2022 0.68 0.57 - 1.00 mg/dL Final   Creatinine, Urine  Date Value Ref Range Status  12/24/2017 194.00 mg/dL Final         Failed - K in normal range and within 180 days    Potassium  Date Value Ref Range Status  10/24/2022 4.3 3.5 - 5.2 mmol/L Final         Failed - Na in normal range and within 180 days    Sodium  Date Value Ref Range Status  10/24/2022 136 134 - 144 mmol/L Final         Passed - Last BP in normal range    BP Readings from Last 1 Encounters:  05/02/23 122/87         Passed - Valid encounter within last 6 months    Recent Outpatient Visits           3 weeks ago Left arm numbness   [place] [facility] Grayce Sessions, NP   7 months ago Iron deficiency anemia, unspecified iron deficiency anemia type   [place] [facility] Grayce Sessions, NP   1 year ago Cervical cancer screening   [place] [facility] Grayce Sessions, NP   1 year ago Iron deficiency anemia, unspecified iron deficiency anemia type   [place] [facility] Grayce Sessions, NP   2 years ago Iron deficiency anemia, unspecified iron deficiency anemia type   [place] [facility] Grayce Sessions, NP              Signed Prescriptions Disp Refills   amLODipine (NORVASC) 10 MG tablet 90 tablet 0    Sig: Take 1  tablet (10 mg total) by mouth daily.     Cardiovascular: Calcium Channel Blockers 2 Passed - 05/25/2023  3:48 PM      Passed - Last BP in normal range    BP Readings from Last 1 Encounters:  05/02/23 122/87         Passed - Last Heart Rate in normal range    Pulse Readings from Last 1 Encounters:  05/02/23 94         Passed - Valid encounter within last 6 months    Recent Outpatient Visits           3 weeks ago Left arm numbness   [place] [facility] Grayce Sessions, NP   7 months ago Iron deficiency anemia, unspecified iron deficiency anemia type   [place] [facility] Grayce Sessions, NP   1 year ago Cervical cancer screening   [place] [facility] Grayce Sessions, NP   1 year ago Iron deficiency anemia, unspecified iron deficiency anemia type   [place] [facility] Grayce Sessions, NP   2 years  ago Iron deficiency anemia, unspecified iron deficiency anemia type   [place] [facility] Grayce Sessions, NP

## 2023-05-25 NOTE — Telephone Encounter (Unsigned)
Copied from CRM [id number]. Topic: General - Other >> May 25, 2023  1:45 PM Everette C wrote: Reason for CRM: Medication Refill - Medication: amLODipine (NORVASC) 10 MG tablet [[id number]]  hydrochlorothiazide (HYDRODIURIL) 25 MG tablet [[id number]]  Has the patient contacted their pharmacy? Yes.   (Agent: If no, request that the patient contact the pharmacy for the refill. If patient does not wish to contact the pharmacy document the reason why and proceed with request.) (Agent: If yes, when and what did the pharmacy advise?)  Preferred Pharmacy (with phone number or street name): [facility] - [place], [place] - [address] [place] [address] Phone: [phone] Fax: [phone] Hours: Not open 24 hours   Has the patient been seen for an appointment in the last year OR does the patient have an upcoming appointment? Yes.    Agent: Please be advised that RX refills may take up to 3 business days. We ask that you follow-up with your pharmacy.

## 2023-05-25 NOTE — Telephone Encounter (Signed)
Requested Prescriptions  Pending Prescriptions Disp Refills   amLODipine (NORVASC) 10 MG tablet 90 tablet 0    Sig: Take 1 tablet (10 mg total) by mouth daily.     Cardiovascular: Calcium Channel Blockers 2 Passed - 05/25/2023  3:48 PM      Passed - Last BP in normal range    BP Readings from Last 1 Encounters:  05/02/23 122/87         Passed - Last Heart Rate in normal range    Pulse Readings from Last 1 Encounters:  05/02/23 94         Passed - Valid encounter within last 6 months    Recent Outpatient Visits           3 weeks ago Left arm numbness   [place] [facility] Grayce Sessions, NP   7 months ago Iron deficiency anemia, unspecified iron deficiency anemia type   [place] [facility] Grayce Sessions, NP   1 year ago Cervical cancer screening   [place] [facility] Grayce Sessions, NP   1 year ago Iron deficiency anemia, unspecified iron deficiency anemia type   [place] [facility] Grayce Sessions, NP   2 years ago Iron deficiency anemia, unspecified iron deficiency anemia type   [place] [facility] Grayce Sessions, NP               hydrochlorothiazide (HYDRODIURIL) 25 MG tablet 90 tablet 0    Sig: Take 1 tablet (25 mg total) by mouth daily.     Cardiovascular: Diuretics - Thiazide Failed - 05/25/2023  3:48 PM      Failed - Cr in normal range and within 180 days    Creatinine, Ser  Date Value Ref Range Status  10/24/2022 0.68 0.57 - 1.00 mg/dL Final   Creatinine, Urine  Date Value Ref Range Status  12/24/2017 194.00 mg/dL Final         Failed - K in normal range and within 180 days    Potassium  Date Value Ref Range Status  10/24/2022 4.3 3.5 - 5.2 mmol/L Final         Failed - Na in normal range and within 180 days    Sodium  Date Value Ref Range Status  10/24/2022 136 134 - 144 mmol/L Final         Passed - Last BP in normal range     BP Readings from Last 1 Encounters:  05/02/23 122/87         Passed - Valid encounter within last 6 months    Recent Outpatient Visits           3 weeks ago Left arm numbness   [place] [facility] Grayce Sessions, NP   7 months ago Iron deficiency anemia, unspecified iron deficiency anemia type   [place] [facility] Grayce Sessions, NP   1 year ago Cervical cancer screening   [place] [facility] Grayce Sessions, NP   1 year ago Iron deficiency anemia, unspecified iron deficiency anemia type   [place] [facility] Grayce Sessions, NP   2 years ago Iron deficiency anemia, unspecified iron deficiency anemia type   [place] [facility] Grayce Sessions, NP

## 2023-05-25 NOTE — Telephone Encounter (Signed)
Will forward to provider  

## 2023-05-28 ENCOUNTER — Ambulatory Visit (INDEPENDENT_AMBULATORY_CARE_PROVIDER_SITE_OTHER): Payer: Self-pay | Admitting: *Deleted

## 2023-05-28 NOTE — Telephone Encounter (Signed)
  Chief Complaint: Swelling Symptoms: States ankles swollen "Big"  States she has been out of her hydrochlorothiazide for 4 days.  Frequency: Saturday Pertinent Negatives: Patient denies  Disposition: [] ED /[] Urgent Care (no appt availability in office) / [] Appointment(In office/virtual)/ []  [facility]/ [] Home Care/ [] Refused Recommended Disposition /[] [facility]/ [x]  Follow-up with PCP Additional Notes: Pt states PCP signed off on one med but not the hydrochlorothiazide. Noted labs were due, request had been routed from [facility] Rx pool.  Pt last appt 05/02/23. Please advise.  Reason for Disposition  [1] MILD swelling of both ankles (i.e., pedal edema) AND [2] new-onset or worsening  Answer Assessment - Initial Assessment Questions 1. ONSET: "When did the swelling start?" (e.g., minutes, hours, days)     Saturday 2. LOCATION: "What part of the leg is swollen?"  "Are both legs swollen or just one leg?"     Both ankles 3. SEVERITY: "How bad is the swelling?" (e.g., localized; mild, moderate, severe)   - Localized: Small area of swelling localized to one leg.   - MILD pedal edema: Swelling limited to foot and ankle, pitting edema < 1/4 inch (6 mm) deep, rest and elevation eliminate most or all swelling.   - MODERATE edema: Swelling of lower leg to knee, pitting edema > 1/4 inch (6 mm) deep, rest and elevation only partially reduce swelling.   - SEVERE edema: Swelling extends above knee, facial or hand swelling present.      Moderate 4. REDNESS: "Does the swelling look red or infected?"     N 5. PAIN: "Is the swelling painful to touch?" If Yes, ask: "How painful is it?"   (Scale 1-10; mild, moderate or severe)    No 6. FEVER: "Do you have a fever?" If Yes, ask: "What is it, how was it measured, and when did it start?"      No 7. CAUSE: "What do you think is causing the leg swelling?"     No HCTZ 8. MEDICAL HISTORY: "Do you have a history of blood clots (e.g., DVT),  cancer, heart failure, kidney disease, or liver failure?"     *No Answer* 9. RECURRENT SYMPTOM: "Have you had leg swelling before?" If Yes, ask: "When was the last time?" "What happened that time?"     Standing a lot 10. OTHER SYMPTOMS: "Do you have any other symptoms?" (e.g., chest pain, difficulty breathing)       no  Protocols used: Leg Swelling and Edema-A-AH

## 2023-05-29 ENCOUNTER — Other Ambulatory Visit (INDEPENDENT_AMBULATORY_CARE_PROVIDER_SITE_OTHER): Payer: Self-pay | Admitting: Primary Care

## 2023-05-29 DIAGNOSIS — I10 Essential (primary) hypertension: Secondary | ICD-10-CM

## 2023-05-29 MED ORDER — HYDROCHLOROTHIAZIDE 25 MG PO TABS: 25.0000 mg | ORAL_TABLET | Freq: Every day | ORAL | 0 refills | Status: DC

## 2023-05-29 NOTE — Telephone Encounter (Signed)
Will forward to provider  

## 2023-08-14 ENCOUNTER — Other Ambulatory Visit (INDEPENDENT_AMBULATORY_CARE_PROVIDER_SITE_OTHER): Payer: Self-pay | Admitting: Primary Care

## 2023-08-14 DIAGNOSIS — I10 Essential (primary) hypertension: Secondary | ICD-10-CM

## 2023-08-15 NOTE — Telephone Encounter (Signed)
Requested Prescriptions  Pending Prescriptions Disp Refills   hydrochlorothiazide (HYDRODIURIL) 25 MG tablet [Pharmacy Med Name: HYDROCHLOROTHIAZIDE 25 MG ORAL TABLET] 90 tablet 0    Sig: TAKE 1 TABLET (25 MG TOTAL) BY MOUTH DAILY.     Cardiovascular: Diuretics - Thiazide Failed - 08/14/2023  1:32 PM      Failed - Cr in normal range and within 180 days    Creatinine, Ser  Date Value Ref Range Status  10/24/2022 0.68 0.57 - 1.00 mg/dL Final   Creatinine, Urine  Date Value Ref Range Status  12/24/2017 194.00 mg/dL Final         Failed - K in normal range and within 180 days    Potassium  Date Value Ref Range Status  10/24/2022 4.3 3.5 - 5.2 mmol/L Final         Failed - Na in normal range and within 180 days    Sodium  Date Value Ref Range Status  10/24/2022 136 134 - 144 mmol/L Final         Passed - Last BP in normal range    BP Readings from Last 1 Encounters:  05/02/23 122/87         Passed - Valid encounter within last 6 months    Recent Outpatient Visits           3 months ago Left arm numbness   [place] [facility] Grayce Sessions, NP   9 months ago Iron deficiency anemia, unspecified iron deficiency anemia type   [place] [facility] Grayce Sessions, NP   1 year ago Cervical cancer screening   [place] [facility] Grayce Sessions, NP   1 year ago Iron deficiency anemia, unspecified iron deficiency anemia type   [place] [facility] Grayce Sessions, NP   2 years ago Iron deficiency anemia, unspecified iron deficiency anemia type    [facility] Grayce Sessions, NP

## 2024-01-01 ENCOUNTER — Other Ambulatory Visit (INDEPENDENT_AMBULATORY_CARE_PROVIDER_SITE_OTHER): Payer: Self-pay | Admitting: Primary Care

## 2024-01-01 DIAGNOSIS — I10 Essential (primary) hypertension: Secondary | ICD-10-CM

## 2024-01-01 NOTE — Telephone Encounter (Signed)
Copied from CRM [id number]. Topic: Clinical - Medication Refill >> Jan 01, 2024  9:08 AM Antwanette L wrote: Most Recent Primary Care Visit:  Provider: Grayce Sessions  Department: [facility]  Visit Type: OFFICE VISIT  Date: 05/02/2023  Medication: amLODipine (NORVASC) 10 MG tablet and hydrochlorothiazide (HYDRODIURIL) 25 MG tablet    Has the patient contacted their pharmacy? Yes (Agent: If no, request that the patient contact the pharmacy for the refill. If patient does not wish to contact the pharmacy document the reason why and proceed with request.) (Agent: If yes, when and what did the pharmacy advise?)  Is this the correct pharmacy for this prescription? Yes If no, delete pharmacy and type the correct one.  This is the patient's preferred pharmacy:  [facility] - [place], [place] - [address] [place] [address] Phone: [phone] Fax: [phone]   Has the prescription been filled recently? No  Is the patient out of the medication? Yes  Has the patient been seen for an appointment in the last year OR does the patient have an upcoming appointment? Yes  Can we respond through MyChart? No. Please contact patient at the number on file [phone].  Agent: Please be advised that Rx refills may take up to 3 business days. We ask that you follow-up with your pharmacy.

## 2024-01-01 NOTE — Telephone Encounter (Signed)
Multiple requests from interface surescripts. Future visit in 1 week. Overdue OV. Courtesy refill.  Requested Prescriptions  Pending Prescriptions Disp Refills   amLODipine (NORVASC) 10 MG tablet [Pharmacy Med Name: AMLODIPINE BESYLATE 10 MG ORAL TABLET] 30 tablet 0    Sig: TAKE 1 TABLET (10 MG TOTAL) BY MOUTH DAILY.     Cardiovascular: Calcium Channel Blockers 2 Failed - 01/01/2024  3:45 PM      Failed - Valid encounter within last 6 months    Recent Outpatient Visits           8 months ago Left arm numbness   [place] [facility] Grayce Sessions, NP   1 year ago Iron deficiency anemia, unspecified iron deficiency anemia type   [place] [facility] Grayce Sessions, NP   1 year ago Cervical cancer screening   [place] [facility] Grayce Sessions, NP   2 years ago Iron deficiency anemia, unspecified iron deficiency anemia type   [place] [facility] Grayce Sessions, NP   2 years ago Iron deficiency anemia, unspecified iron deficiency anemia type   [place] [facility] Grayce Sessions, NP       Future Appointments             In 1 week Grayce Sessions, NP [place] [facility]            Passed - Last BP in normal range    BP Readings from Last 1 Encounters:  05/02/23 122/87         Passed - Last Heart Rate in normal range    Pulse Readings from Last 1 Encounters:  05/02/23 94

## 2024-01-01 NOTE — Telephone Encounter (Signed)
Requested medication (s) are due for refill today: na   Requested medication (s) are on the active medication list: yes   Last refill:  hydrodiuril- 08/15/23 #90 0 refills , norvasc- 05/29/23 #30 0 refills   Future visit scheduled: yes in 1 week   Notes to clinic:  protocol failed last labs 10/24/22. Do you want to give another courtesy refill?     Requested Prescriptions  Pending Prescriptions Disp Refills   hydrochlorothiazide (HYDRODIURIL) 25 MG tablet 90 tablet 0    Sig: Take 1 tablet (25 mg total) by mouth daily.     Cardiovascular: Diuretics - Thiazide Failed - 01/01/2024 12:31 PM      Failed - Cr in normal range and within 180 days    Creatinine, Ser  Date Value Ref Range Status  10/24/2022 0.68 0.57 - 1.00 mg/dL Final   Creatinine, Urine  Date Value Ref Range Status  12/24/2017 194.00 mg/dL Final         Failed - K in normal range and within 180 days    Potassium  Date Value Ref Range Status  10/24/2022 4.3 3.5 - 5.2 mmol/L Final         Failed - Na in normal range and within 180 days    Sodium  Date Value Ref Range Status  10/24/2022 136 134 - 144 mmol/L Final         Failed - Valid encounter within last 6 months    Recent Outpatient Visits           8 months ago Left arm numbness   [place] [facility] Grayce Sessions, NP   1 year ago Iron deficiency anemia, unspecified iron deficiency anemia type   [place] [facility] Grayce Sessions, NP   1 year ago Cervical cancer screening   [place] [facility] Grayce Sessions, NP   2 years ago Iron deficiency anemia, unspecified iron deficiency anemia type   [place] [facility] Grayce Sessions, NP   2 years ago Iron deficiency anemia, unspecified iron deficiency anemia type   [place] [facility] Grayce Sessions, NP       Future Appointments             In 1 week Grayce Sessions, NP [facility]            Passed - Last BP in normal range    BP Readings from Last 1 Encounters:  05/02/23 122/87          amLODipine (NORVASC) 10 MG tablet 30 tablet 0    Sig: Take 1 tablet (10 mg total) by mouth daily.     Cardiovascular: Calcium Channel Blockers 2 Failed - 01/01/2024 12:31 PM      Failed - Valid encounter within last 6 months    Recent Outpatient Visits           8 months ago Left arm numbness   [place] [facility] Grayce Sessions, NP   1 year ago Iron deficiency anemia, unspecified iron deficiency anemia type   [place] [facility] Grayce Sessions, NP   1 year ago Cervical cancer screening   [place] [facility] Grayce Sessions, NP   2 years ago Iron deficiency anemia, unspecified iron deficiency anemia type   [place] [facility] Grayce Sessions, NP   2 years ago Iron deficiency  anemia, unspecified iron deficiency anemia type   [place] [facility] Grayce Sessions, NP       Future Appointments             In 1 week Randa Evens Kinnie Scales, NP [place] [facility]            Passed - Last BP in normal range    BP Readings from Last 1 Encounters:  05/02/23 122/87         Passed - Last Heart Rate in normal range    Pulse Readings from Last 1 Encounters:  05/02/23 94

## 2024-01-02 ENCOUNTER — Telehealth: Payer: Self-pay

## 2024-01-02 ENCOUNTER — Other Ambulatory Visit: Payer: Self-pay

## 2024-01-02 DIAGNOSIS — I10 Essential (primary) hypertension: Secondary | ICD-10-CM

## 2024-01-02 MED ORDER — HYDROCHLOROTHIAZIDE 25 MG PO TABS
25.0000 mg | ORAL_TABLET | Freq: Every day | ORAL | 0 refills | Status: DC
Start: 2024-01-02 — End: 2024-01-14

## 2024-01-02 NOTE — Telephone Encounter (Signed)
Norvasc sent on 01/01/2024. Hydrochlorothiazide 25 mg sent today

## 2024-01-02 NOTE — Telephone Encounter (Signed)
Copied from CRM [id number]. Topic: Clinical - Prescription Issue >> Jan 01, 2024  9:15 AM Antwanette L wrote: Reason for CRM: Patient is currently out of  amLODipine (NORVASC) 10 MG tablet hydrochlorothiazide (HYDRODIURIL) 25 MG tablet. A prescription refill request has been submitted. If the patient needs to be contacted, she can be reached at [phone]

## 2024-01-07 ENCOUNTER — Telehealth (INDEPENDENT_AMBULATORY_CARE_PROVIDER_SITE_OTHER): Payer: Self-pay | Admitting: Primary Care

## 2024-01-07 ENCOUNTER — Ambulatory Visit (INDEPENDENT_AMBULATORY_CARE_PROVIDER_SITE_OTHER): Payer: 59 | Admitting: Primary Care

## 2024-01-07 NOTE — Telephone Encounter (Signed)
 Called pt to remind them about atp. Pt will be present

## 2024-01-14 ENCOUNTER — Encounter (INDEPENDENT_AMBULATORY_CARE_PROVIDER_SITE_OTHER): Payer: Self-pay | Admitting: Primary Care

## 2024-01-14 ENCOUNTER — Ambulatory Visit (INDEPENDENT_AMBULATORY_CARE_PROVIDER_SITE_OTHER): Payer: 59 | Admitting: Primary Care

## 2024-01-14 VITALS — BP 132/90 | HR 77 | Resp 16 | Ht 63.0 in | Wt 142.0 lb

## 2024-01-14 DIAGNOSIS — Z1159 Encounter for screening for other viral diseases: Secondary | ICD-10-CM | POA: Diagnosis not present

## 2024-01-14 DIAGNOSIS — J069 Acute upper respiratory infection, unspecified: Secondary | ICD-10-CM

## 2024-01-14 DIAGNOSIS — D509 Iron deficiency anemia, unspecified: Secondary | ICD-10-CM

## 2024-01-14 DIAGNOSIS — Z760 Encounter for issue of repeat prescription: Secondary | ICD-10-CM | POA: Diagnosis not present

## 2024-01-14 DIAGNOSIS — I10 Essential (primary) hypertension: Secondary | ICD-10-CM

## 2024-01-14 DIAGNOSIS — L600 Ingrowing nail: Secondary | ICD-10-CM | POA: Diagnosis not present

## 2024-01-14 MED ORDER — HYDROCHLOROTHIAZIDE 25 MG PO TABS: 25.0000 mg | ORAL_TABLET | Freq: Every day | ORAL | 1 refills | Status: DC

## 2024-01-14 MED ORDER — AMLODIPINE BESYLATE 10 MG PO TABS: 10.0000 mg | ORAL_TABLET | Freq: Every day | ORAL | 1 refills | Status: DC

## 2024-01-14 NOTE — Progress Notes (Signed)
 [facility]  Morgan Lee, is a 43 y.o. female  ZOX:[id number]  [id number]:[id number]  DOB - 1981/07/15  Chief Complaint  Patient presents with   Hypertension   Referral    Podiatry       Subjective:   Morgan Lee is a 43 y.o. female here today for an acute visit. She thinks she had the flu about 3 or 4 days ago.  She had fever, chills, sore throat body aching and a cough that will not leave.  She also voiced concerns about her left foot toenail second toe is crooked and possible fungus Management of HTN- Patient has No headache, No chest pain, No abdominal pain - No Nausea, No new weakness tingling or numbness, No Cough - shortness of breath   No problems updated.  Comprehensive ROS Pertinent positive and negative noted in HPI   No Known Allergies  Past Medical History:  Diagnosis Date   [redacted] weeks gestation of pregnancy    Advanced maternal age in multigravida, second trimester    Depression    Fatigue    Headaches due to old head injury    Palpitation    SOB (shortness of breath)    Syncope     Current Outpatient Medications on File Prior to Visit  Medication Sig Dispense Refill   amLODipine (NORVASC) 10 MG tablet TAKE 1 TABLET (10 MG TOTAL) BY MOUTH DAILY. 30 tablet 0   Blood Pressure Monitor KIT 1 kit by Does not apply route 3 (three) times daily. (Patient not taking: Reported on 12/28/2021) 1 kit 0   hydrochlorothiazide (HYDRODIURIL) 25 MG tablet Take 1 tablet (25 mg total) by mouth daily. 30 tablet 0   triamcinolone cream (KENALOG) 0.1 % APPLY 1 APPLICATION TOPICALLY 2 (TWO) TIMES DAILY. 454 g 1   No current facility-administered medications on file prior to visit.   Health Maintenance  Topic Date Due   Hepatitis C Screening  Never done   Pap with HPV screening  Never done   Flu Shot  Never done   COVID-19 Vaccine (1 - 2024-25 season) Never done   DTaP/Tdap/Td vaccine (3 - Td or Tdap) 08/22/2028   HIV Screening  Completed   HPV Vaccine  Aged Out     Objective:   Vitals:   01/14/24 1406  BP: (!) 132/90  Pulse: 77  Resp: 16  SpO2: 98%  Weight: 142 lb (64.4 kg)  Height: 5\' 3"  (1.6 m)   BP Readings from Last 3 Encounters:  01/14/24 (!) 132/90  05/02/23 122/87  11/15/22 115/88      Physical Exam Vitals reviewed.  Constitutional:      Appearance: Normal appearance.  HENT:     Head: Normocephalic.     Right Ear: Tympanic membrane, ear canal and external ear normal.     Left Ear: Tympanic membrane, ear canal and external ear normal.     Nose: Nose normal.     Mouth/Throat:     Mouth: Mucous membranes are moist.  Eyes:     Extraocular Movements: Extraocular movements intact.     Pupils: Pupils are equal, round, and reactive to light.  Cardiovascular:     Rate and Rhythm: Normal rate.  Pulmonary:     Effort: Pulmonary effort is normal.     Breath sounds: Normal breath sounds.  Abdominal:     General: Bowel sounds are normal.     Palpations: Abdomen is soft.  Musculoskeletal:        General: Normal range of  motion.     Cervical back: Normal range of motion.  Skin:    General: Skin is warm and dry.  Neurological:     Mental Status: She is alert and oriented to person, place, and time.  Psychiatric:        Mood and Affect: Mood normal.        Behavior: Behavior normal.        Thought Content: Thought content normal.       Assessment & Plan  Morgan Lee was seen today for hypertension and referral.  Diagnoses and all orders for this visit:  Onychocryptosis   -     Ambulatory referral to Podiatry  Acute upper respiratory infection self resolved except productive cough able to expectant- ANY otc COUGH MEDS WITHOUT SUDAFED   -     CBC with Differential/Platelet-   -     CMP14+EGFR  Encounter for HCV screening test for low risk patient -     HCV Ab w Reflex to Quant PCR  Iron deficiency anemia, unspecified iron deficiency anemia type -     CBC with Differential/Platelet  Hypertension, unspecified  type BP goal - < 130/80 Explained that having normal blood pressure is the goal and medications are helping to get to goal and maintain normal blood pressure. DIET: Limit salt intake, read nutrition labels to check salt content, limit fried and high fatty foods  Avoid using multisymptom OTC cold preparations that generally contain sudafed which can rise BP. Consult with pharmacist on best cold relief products to use for persons with HTN EXERCISE Discussed incorporating exercise such as walking - 30 minutes most days of the week and can do in 10 minute intervals    -     hydrochlorothiazide (HYDRODIURIL) 25 MG tablet; Take 1 tablet (25 mg total) by mouth daily. -     amLODipine (NORVASC) 10 MG tablet; Take 1 tablet (10 mg total) by mouth daily.  Medication refill -     hydrochlorothiazide (HYDRODIURIL) 25 MG tablet; Take 1 tablet (25 mg total) by mouth daily. -     amLODipine (NORVASC) 10 MG tablet; Take 1 tablet (10 mg total) by mouth daily.       Patient have been counseled extensively about nutrition and exercise. Other issues discussed during this visit include: low cholesterol diet, weight control and daily exercise, foot care, annual eye examinations at Ophthalmology, importance of adherence with medications and regular follow-up. We also discussed long term complications of uncontrolled diabetes and hypertension.   Return in about 3 months (around 04/12/2024).  The patient was given clear instructions to go to ER or return to medical center if symptoms don't improve, worsen or new problems develop. The patient verbalized understanding. The patient was told to call to get lab results if they haven't heard anything in the next week.   This note has been created with Education officer, environmental. Any transcriptional errors are unintentional.   Morgan Sessions, NP 01/14/2024, 2:10 PM

## 2024-01-15 LAB — CBC WITH DIFFERENTIAL/PLATELET
Basophils Absolute: 0 10*3/uL (ref 0.0–0.2)
Basos: 1 %
EOS (ABSOLUTE): 0.3 10*3/uL (ref 0.0–0.4)
Eos: 7 %
Hematocrit: 34.4 % (ref 34.0–46.6)
Hemoglobin: 10.5 g/dL — ABNORMAL LOW (ref 11.1–15.9)
Immature Grans (Abs): 0 10*3/uL (ref 0.0–0.1)
Immature Granulocytes: 0 %
Lymphocytes Absolute: 1.4 10*3/uL (ref 0.7–3.1)
Lymphs: 35 %
MCH: 23.1 pg — ABNORMAL LOW (ref 26.6–33.0)
MCHC: 30.5 g/dL — ABNORMAL LOW (ref 31.5–35.7)
MCV: 76 fL — ABNORMAL LOW (ref 79–97)
Monocytes Absolute: 0.3 10*3/uL (ref 0.1–0.9)
Monocytes: 6 %
Neutrophils Absolute: 2.1 10*3/uL (ref 1.4–7.0)
Neutrophils: 51 %
Platelets: 311 10*3/uL (ref 150–450)
RBC: 4.54 x10E6/uL (ref 3.77–5.28)
RDW: 16.4 % — ABNORMAL HIGH (ref 11.7–15.4)
WBC: 4.1 10*3/uL (ref 3.4–10.8)

## 2024-01-15 LAB — CMP14+EGFR
ALT: 29 IU/L (ref 0–32)
AST: 31 IU/L (ref 0–40)
Albumin: 4 g/dL (ref 3.9–4.9)
Alkaline Phosphatase: 83 IU/L (ref 44–121)
BUN/Creatinine Ratio: 9 (ref 9–23)
BUN: 5 mg/dL — ABNORMAL LOW (ref 6–24)
Bilirubin Total: 0.2 mg/dL (ref 0.0–1.2)
CO2: 24 mmol/L (ref 20–29)
Calcium: 9.1 mg/dL (ref 8.7–10.2)
Chloride: 101 mmol/L (ref 96–106)
Creatinine, Ser: 0.58 mg/dL (ref 0.57–1.00)
Globulin, Total: 3.3 g/dL (ref 1.5–4.5)
Glucose: 79 mg/dL (ref 70–99)
Potassium: 4.7 mmol/L (ref 3.5–5.2)
Sodium: 141 mmol/L (ref 134–144)
Total Protein: 7.3 g/dL (ref 6.0–8.5)
eGFR: 115 mL/min/{1.73_m2} (ref >59)

## 2024-01-15 LAB — HCV AB W REFLEX TO QUANT PCR: HCV Ab: NONREACTIVE

## 2024-01-15 LAB — HCV INTERPRETATION

## 2024-01-19 ENCOUNTER — Ambulatory Visit (HOSPITAL_COMMUNITY)
Admission: EM | Admit: 2024-01-19 | Discharge: 2024-01-19 | Disposition: A | Discharge status: Home / Self Care | Attending: Emergency Medicine | Admitting: Emergency Medicine

## 2024-01-19 ENCOUNTER — Encounter (HOSPITAL_COMMUNITY): Payer: Self-pay | Admitting: Emergency Medicine

## 2024-01-19 DIAGNOSIS — J019 Acute sinusitis, unspecified: Secondary | ICD-10-CM | POA: Diagnosis not present

## 2024-01-19 DIAGNOSIS — B9689 Other specified bacterial agents as the cause of diseases classified elsewhere: Secondary | ICD-10-CM | POA: Diagnosis not present

## 2024-01-19 DIAGNOSIS — R051 Acute cough: Secondary | ICD-10-CM | POA: Diagnosis not present

## 2024-01-19 MED ORDER — AMOXICILLIN-POT CLAVULANATE 875-125 MG PO TABS: 1.0000 | ORAL_TABLET | Freq: Two times a day (BID) | ORAL | 0 refills | Status: DC

## 2024-01-19 MED ORDER — PROMETHAZINE-DM 6.25-15 MG/5ML PO SYRP: 5.0000 mL | ORAL_SOLUTION | Freq: Four times a day (QID) | ORAL | 0 refills | Status: DC | PRN

## 2024-01-19 NOTE — ED Provider Notes (Signed)
 [facility]    CSN: [id number] Arrival date & time: 01/19/24  1615      History   Chief Complaint Chief Complaint  Patient presents with   Generalized Body Aches   Cough   Nasal Congestion   Sore Throat    HPI Morgan Lee is a 43 y.o. female.   Patient presents to clinic over concerns of sore throat, productive cough with yellow sputum, sinus pain, sinus pressure, congestion, rhinorrhea, generalized bodyaches and fatigue for the past week.  Has been taking over-the-counter treatments such as cough drops and tea.  Did see her primary care provider earlier last week and was advised symptomatic management, she has been doing this and they have not been helping.  The history is provided by the patient and medical records.  Cough Sore Throat    Past Medical History:  Diagnosis Date   [redacted] weeks gestation of pregnancy    Advanced maternal age in multigravida, second trimester    Depression    Fatigue    Headaches due to old head injury    Palpitation    SOB (shortness of breath)    Syncope     Patient Active Problem List   Diagnosis Date Noted   Gestational hypertension 12/22/2017   Indication for care in labor or delivery 12/20/2017   [redacted] weeks gestation of pregnancy    Advanced maternal age in multigravida, second trimester    Anemia complicating pregnancy in third trimester 06/23/2012   Desires VBAC (vaginal birth after cesarean) trial 04/23/2012   Prenatal care, subsequent pregnancy 04/23/2012    Past Surgical History:  Procedure Laterality Date   CESAREAN SECTION     HERNIA REPAIR     TUBAL LIGATION Bilateral 12/21/2017   Procedure: POST PARTUM TUBAL LIGATION;  Surgeon: Hermina Staggers, MD;  Location: [facility];  Service: Gynecology;  Laterality: Bilateral;    OB History     Gravida  11   Para  4   Term  4   Preterm      AB  7   Living  4      SAB  1   IAB  6   Ectopic      Multiple  0   Live Births  1             Home Medications    Prior to Admission medications   Medication Sig Start Date End Date Taking? Authorizing Provider  amoxicillin-clavulanate (AUGMENTIN) 875-125 MG tablet Take 1 tablet by mouth every 12 (twelve) hours. 01/19/24  Yes Rinaldo Ratel, [place] N, FNP  promethazine-dextromethorphan (PROMETHAZINE-DM) 6.25-15 MG/5ML syrup Take 5 mLs by mouth 4 (four) times daily as needed for cough. 01/19/24  Yes Rinaldo Ratel, [place] N, FNP  amLODipine (NORVASC) 10 MG tablet Take 1 tablet (10 mg total) by mouth daily. 01/14/24   Grayce Sessions, NP  Blood Pressure Monitor KIT 1 kit by Does not apply route 3 (three) times daily. Patient not taking: Reported on 12/28/2021 11/27/19   Grayce Sessions, NP  hydrochlorothiazide (HYDRODIURIL) 25 MG tablet Take 1 tablet (25 mg total) by mouth daily. 01/14/24   Grayce Sessions, NP    Family History Family History  Problem Relation Age of Onset   Hypertension Mother    Cancer - Lung Mother    Hypertension Sister    Diabetes Sister    Seizures Sister    Thyroid disease Sister    Heart disease Sister  stents   Asthma Sister    Hypertension Brother     Social History Social History   Tobacco Use   Smoking status: Former   Smokeless tobacco: Never  Substance Use Topics   Alcohol use: Yes    Comment: occ./none with preg; stopped when preg   Drug use: Yes    Types: Marijuana    Comment: daily; stopped once found out preg     Allergies   Patient has no known allergies.   Review of Systems Review of Systems  Per HPI   Physical Exam Triage Vital Signs ED Triage Vitals  Encounter Vitals Group     BP 01/19/24 1710 119/75     Systolic BP Percentile --      Diastolic BP Percentile --      Pulse Rate 01/19/24 1710 (!) 117     Resp 01/19/24 1710 18     Temp 01/19/24 1710 98.8 F (37.1 C)     Temp Source 01/19/24 1710 Oral     SpO2 01/19/24 1710 98 %     Weight --      Height --      Head Circumference --      Peak Flow --       Pain Score 01/19/24 1713 10     Pain Loc --      Pain Education --      Exclude from Growth Chart --    No data found.  Updated Vital Signs BP 119/75 (BP Location: Left Arm)   Pulse (!) 117   Temp 98.8 F (37.1 C) (Oral)   Resp 18   LMP 01/15/2024 (Approximate)   SpO2 98%   Visual Acuity Right Eye Distance:   Left Eye Distance:   Bilateral Distance:    Right Eye Near:   Left Eye Near:    Bilateral Near:     Physical Exam Vitals and nursing note reviewed.  Constitutional:      Appearance: Normal appearance. She is well-developed.  HENT:     Head: Normocephalic and atraumatic.     Right Ear: External ear normal.     Left Ear: External ear normal.     Nose: Rhinorrhea present. No congestion.     Mouth/Throat:     Mouth: Mucous membranes are moist.     Pharynx: Uvula midline. Posterior oropharyngeal erythema present.     Tonsils: No tonsillar exudate or tonsillar abscesses.  Eyes:     Conjunctiva/sclera: Conjunctivae normal.  Cardiovascular:     Rate and Rhythm: Normal rate and regular rhythm.     Heart sounds: Normal heart sounds. No murmur heard. Pulmonary:     Effort: Pulmonary effort is normal. No respiratory distress.     Breath sounds: Normal breath sounds.  Musculoskeletal:        General: Normal range of motion.  Skin:    General: Skin is warm and dry.  Neurological:     General: No focal deficit present.     Mental Status: She is alert and oriented to person, place, and time.  Psychiatric:        Mood and Affect: Mood normal.        Behavior: Behavior normal. Behavior is cooperative.      UC Treatments / Results  Labs (all labs ordered are listed, but only abnormal results are displayed) Labs Reviewed - No data to display  EKG   Radiology No results found.  Procedures Procedures (including critical care time)  Medications Ordered  in UC Medications - No data to display  Initial Impression / Assessment and Plan / UC Course  I have  reviewed the triage vital signs and the nursing notes.  Pertinent labs & imaging results that were available during my care of the patient were reviewed by me and considered in my medical decision making (see chart for details).  Vitals and triage reviewed, patient is hemodynamically stable.  Congestion and rhinorrhea present on physical exam.  Bilateral maxillary sinus tenderness.  Lungs are vesicular, or with her other rate and rhythm.  Symptoms have been persistent and worsening over the past week with rebounding.  Will cover with Augmentin for acute bacterial sinusitis.  Cough management and congestion management discussed.  Work note provided.  Plan of care, follow-up care return precautions given, no questions at this time.     Final Clinical Impressions(s) / UC Diagnoses   Final diagnoses:  Acute bacterial sinusitis  Acute cough     Discharge Instructions      Take the antibiotics twice daily with food until finished, even if you start feeling better.  You can use the cough medicine up to 4 times daily as needed, do not drink alcohol or drive on this medication as it may cause sedation.  Taking 1200 mg of Mucinex can also help loosen your secretions.  Ensure you are drinking plenty of fluids and getting lots of rest.  Alternate between 800 mg of ibuprofen and 500 mg of Tylenol every 4-6 hours for any fever, body aches or chills.  Symptoms should improve with these interventions, if no improvement or any changes return to clinic or follow-up with your primary care provider.     ED Prescriptions     Medication Sig Dispense Auth. Provider   promethazine-dextromethorphan (PROMETHAZINE-DM) 6.25-15 MG/5ML syrup Take 5 mLs by mouth 4 (four) times daily as needed for cough. 118 mL Rinaldo Ratel, [place] N, FNP   amoxicillin-clavulanate (AUGMENTIN) 875-125 MG tablet Take 1 tablet by mouth every 12 (twelve) hours. 14 tablet Jazsmin Couse, [place] N, [place]      PDMP not reviewed this encounter.    Jakota Manthei, [place] N, [place] 01/19/24 [phone]

## 2024-01-19 NOTE — Discharge Instructions (Signed)
 Take the antibiotics twice daily with food until finished, even if you start feeling better.  You can use the cough medicine up to 4 times daily as needed, do not drink alcohol or drive on this medication as it may cause sedation.  Taking 1200 mg of Mucinex can also help loosen your secretions.  Ensure you are drinking plenty of fluids and getting lots of rest.  Alternate between 800 mg of ibuprofen and 500 mg of Tylenol every 4-6 hours for any fever, body aches or chills.  Symptoms should improve with these interventions, if no improvement or any changes return to clinic or follow-up with your primary care provider.

## 2024-01-19 NOTE — ED Triage Notes (Signed)
 Patient presents with c/o sore throat, productive cough, nasal congestion, generalized body aches x 1 week.

## 2024-01-28 ENCOUNTER — Encounter: Payer: Self-pay | Admitting: Podiatry

## 2024-01-28 ENCOUNTER — Ambulatory Visit (INDEPENDENT_AMBULATORY_CARE_PROVIDER_SITE_OTHER): Payer: 59 | Admitting: Podiatry

## 2024-01-28 DIAGNOSIS — B351 Tinea unguium: Secondary | ICD-10-CM

## 2024-01-28 DIAGNOSIS — M79675 Pain in left toe(s): Secondary | ICD-10-CM

## 2024-01-28 DIAGNOSIS — M79674 Pain in right toe(s): Secondary | ICD-10-CM

## 2024-01-28 MED ORDER — TERBINAFINE HCL 250 MG PO TABS: 250.0000 mg | ORAL_TABLET | Freq: Every day | ORAL | 0 refills | Status: AC

## 2024-01-28 NOTE — Progress Notes (Signed)
   Chief Complaint  Patient presents with   Nail Problem    Patient states she believes she may have toe nail fungus or an ingrown toe nail on left foot 2nd toe, it has been like this all her life she states. When she wears her work shoes it hurts when it rubs against anything. No medication for pain     Subjective: 43 y.o. female presenting today as a new patient for evaluation of pain and discoloration with thickening to the toenails bilateral ongoing for several years.  Past Medical History:  Diagnosis Date   [redacted] weeks gestation of pregnancy    Advanced maternal age in multigravida, second trimester    Depression    Fatigue    Headaches due to old head injury    Palpitation    SOB (shortness of breath)    Syncope     Past Surgical History:  Procedure Laterality Date   CESAREAN SECTION     HERNIA REPAIR     TUBAL LIGATION Bilateral 12/21/2017   Procedure: POST PARTUM TUBAL LIGATION;  Surgeon: Hermina Staggers, MD;  Location: [facility];  Service: Gynecology;  Laterality: Bilateral;    No Known Allergies  Objective: Physical Exam General: The patient is alert and oriented x3 in no acute distress.  Dermatology: Hyperkeratotic, discolored, thickened, onychodystrophy noted. Skin is warm, dry and supple bilateral lower extremities. Negative for open lesions or macerations.  Vascular: Palpable pedal pulses bilaterally. No edema or erythema noted. Capillary refill within normal limits.  Neurological: Grossly intact via light touch  Musculoskeletal Exam: No pedal deformity noted  Assessment: #1 Onychomycosis of toenails bilateral  Plan of Care:  #1 Patient was evaluated. #2  Today we discussed different treatment options including oral, topical, and laser antifungal treatment modalities.  We discussed their efficacies and side effects.  Patient opts for oral antifungal treatment modality #3 prescription for Lamisil 250 mg #90 daily. Pt denies a history of liver  pathology or symptoms.  Hepatic function 01/14/2024 WNL #4  As a courtesy for the patient mechanical debridement of the nails was performed today using a nail nipper and smoothed with a rotary bur #5 return to clinic 6 months  Felecia Shelling, DPM [facility]  Dr. Felecia Shelling, DPM    [address] [place], [place] [address]                Office [phone]  Fax [phone]

## 2024-02-25 ENCOUNTER — Ambulatory Visit (INDEPENDENT_AMBULATORY_CARE_PROVIDER_SITE_OTHER): Payer: 59 | Admitting: Primary Care

## 2024-04-15 ENCOUNTER — Ambulatory Visit (INDEPENDENT_AMBULATORY_CARE_PROVIDER_SITE_OTHER): Payer: Self-pay | Admitting: Primary Care

## 2024-04-16 ENCOUNTER — Telehealth (INDEPENDENT_AMBULATORY_CARE_PROVIDER_SITE_OTHER): Payer: Self-pay | Admitting: Primary Care

## 2024-04-16 NOTE — Telephone Encounter (Signed)
 Called pt to reschedule missed appt. Pt rescheduled for 6/10

## 2024-04-28 ENCOUNTER — Telehealth (INDEPENDENT_AMBULATORY_CARE_PROVIDER_SITE_OTHER): Payer: Self-pay | Admitting: Primary Care

## 2024-04-28 NOTE — Telephone Encounter (Signed)
 Called pt to confirm appt. Pt will be present.

## 2024-04-29 ENCOUNTER — Ambulatory Visit (INDEPENDENT_AMBULATORY_CARE_PROVIDER_SITE_OTHER): Admitting: Primary Care

## 2024-05-30 ENCOUNTER — Ambulatory Visit (INDEPENDENT_AMBULATORY_CARE_PROVIDER_SITE_OTHER): Admitting: Primary Care

## 2024-07-10 ENCOUNTER — Ambulatory Visit (INDEPENDENT_AMBULATORY_CARE_PROVIDER_SITE_OTHER): Admitting: Primary Care

## 2024-08-04 ENCOUNTER — Ambulatory Visit: Admitting: Podiatry

## 2024-10-21 ENCOUNTER — Encounter (HOSPITAL_COMMUNITY): Payer: Self-pay

## 2024-10-21 ENCOUNTER — Ambulatory Visit (HOSPITAL_COMMUNITY)
Admission: EM | Admit: 2024-10-21 | Discharge: 2024-10-21 | Disposition: A | Discharge status: Home / Self Care | Attending: Family Medicine | Admitting: Family Medicine

## 2024-10-21 DIAGNOSIS — R112 Nausea with vomiting, unspecified: Secondary | ICD-10-CM | POA: Diagnosis not present

## 2024-10-21 DIAGNOSIS — A084 Viral intestinal infection, unspecified: Secondary | ICD-10-CM | POA: Diagnosis not present

## 2024-10-21 DIAGNOSIS — R197 Diarrhea, unspecified: Secondary | ICD-10-CM

## 2024-10-21 MED ORDER — ONDANSETRON 4 MG PO TBDP
ORAL_TABLET | ORAL | Status: AC
  Filled 2024-10-21: qty 1

## 2024-10-21 MED ORDER — ONDANSETRON 4 MG PO TBDP
4.0000 mg | ORAL_TABLET | Freq: Once | ORAL | Status: AC
  Administered 2024-10-21: 4 mg via ORAL

## 2024-10-21 MED ORDER — ONDANSETRON 4 MG PO TBDP: 4.0000 mg | ORAL_TABLET | Freq: Three times a day (TID) | ORAL | 0 refills | Status: AC | PRN

## 2024-10-21 NOTE — ED Triage Notes (Signed)
 Pt c/o vomiting x3 and diarrhea x3 since this morning. States has had a few crackers and sip of ginger ale and is nauseous.

## 2024-10-21 NOTE — ED Provider Notes (Signed)
 [facility]    CSN: [id number] Arrival date & time: 10/21/24  1659      History   Chief Complaint Chief Complaint  Patient presents with   Emesis    HPI Morgan Lee is a 43 y.o. female.   This 43 year old female is being seen for acute onset of nausea, vomiting, diarrhea.  She reports she woke this morning with symptoms.  She reports 3 episodes of emesis and 3 episodes of diarrhea today.  She describes emesis as appearing yellow.  She says her stool is very watery.  She has not taken any medications for her symptoms.  She has had tubal ligation and recent menstrual cycle.  She denies possibility of pregnancy.  She denies sick contacts.  She says she ate Thanksgiving leftovers and shrimp earlier in the day yesterday.  She denies chest pain, shortness of breath, abdominal pain, urinary symptoms.  While in the waiting room she ate some crackers and a sip of ginger ale.  She has been able to tolerate this as of now.   Emesis Associated symptoms: diarrhea   Associated symptoms: no abdominal pain, no chills, no cough, no fever and no headaches     Past Medical History:  Diagnosis Date   [redacted] weeks gestation of pregnancy    Advanced maternal age in multigravida, second trimester    Depression    Fatigue    Headaches due to old head injury    Palpitation    SOB (shortness of breath)    Syncope     Patient Active Problem List   Diagnosis Date Noted   Gestational hypertension 12/22/2017   Indication for care in labor or delivery 12/20/2017   [redacted] weeks gestation of pregnancy    Advanced maternal age in multigravida, second trimester    Anemia complicating pregnancy in third trimester 06/23/2012   Desires VBAC (vaginal birth after cesarean) trial 04/23/2012   Prenatal care, subsequent pregnancy 04/23/2012    Past Surgical History:  Procedure Laterality Date   CESAREAN SECTION     HERNIA REPAIR     TUBAL LIGATION Bilateral 12/21/2017   Procedure: POST PARTUM TUBAL  LIGATION;  Surgeon: Lorence Ozell CROME, MD;  Location: [facility];  Service: Gynecology;  Laterality: Bilateral;    OB History     Gravida  11   Para  4   Term  4   Preterm      AB  7   Living  4      SAB  1   IAB  6   Ectopic      Multiple  0   Live Births  1            Home Medications    Prior to Admission medications   Medication Sig Start Date End Date Taking? Authorizing Provider  ondansetron  (ZOFRAN -ODT) 4 MG disintegrating tablet Take 1 tablet (4 mg total) by mouth every 8 (eight) hours as needed for nausea or vomiting. 10/21/24  Yes Rikia Sukhu C, FNP  amLODipine  (NORVASC ) 10 MG tablet Take 1 tablet (10 mg total) by mouth daily. 01/14/24   Celestia Rosaline SQUIBB, NP  Blood Pressure Monitor KIT 1 kit by Does not apply route 3 (three) times daily. 11/27/19   Celestia Rosaline SQUIBB, NP  hydrochlorothiazide  (HYDRODIURIL ) 25 MG tablet Take 1 tablet (25 mg total) by mouth daily. 01/14/24   Celestia Rosaline SQUIBB, NP  terbinafine  (LAMISIL ) 250 MG tablet Take 1 tablet (250 mg total) by mouth  daily. 01/28/24   Janit Thresa HERO, DPM    Family History Family History  Problem Relation Age of Onset   Hypertension Mother    Cancer - Lung Mother    Hypertension Sister    Diabetes Sister    Seizures Sister    Thyroid disease Sister    Heart disease Sister        stents   Asthma Sister    Hypertension Brother     Social History Social History   Tobacco Use   Smoking status: Former   Smokeless tobacco: Never  Substance Use Topics   Alcohol use: Yes    Comment: occ./none with preg; stopped when preg   Drug use: Yes    Types: Marijuana    Comment: daily; stopped once found out preg     Allergies   Patient has no known allergies.   Review of Systems Review of Systems  Constitutional:  Positive for appetite change. Negative for activity change, chills and fever.  Respiratory:  Negative for cough and shortness of breath.   Cardiovascular:  Negative for  chest pain.  Gastrointestinal:  Positive for diarrhea, nausea and vomiting. Negative for abdominal pain and blood in stool.  Genitourinary:  Negative for difficulty urinating, dysuria, frequency and urgency.  Skin:  Negative for color change.  Neurological:  Negative for dizziness, syncope, weakness and headaches.     Physical Exam Triage Vital Signs ED Triage Vitals  Encounter Vitals Group     BP 10/21/24 1803 124/85     Girls Systolic BP Percentile --      Girls Diastolic BP Percentile --      Boys Systolic BP Percentile --      Boys Diastolic BP Percentile --      Pulse Rate 10/21/24 1803 78     Resp 10/21/24 1803 16     Temp 10/21/24 1803 98.1 F (36.7 C)     Temp Source 10/21/24 1803 Oral     SpO2 10/21/24 1803 97 %     Weight --      Height --      Head Circumference --      Peak Flow --      Pain Score 10/21/24 1802 0     Pain Loc --      Pain Education --      Exclude from Growth Chart --    No data found.  Updated Vital Signs BP 124/85 (BP Location: Right Arm)   Pulse 78   Temp 98.1 F (36.7 C) (Oral)   Resp 16   LMP 10/06/2024 (Approximate)   SpO2 97%   Visual Acuity Right Eye Distance:   Left Eye Distance:   Bilateral Distance:    Right Eye Near:   Left Eye Near:    Bilateral Near:     Physical Exam Vitals and nursing note reviewed.  Constitutional:      General: She is not in acute distress.    Appearance: She is well-developed. She is not toxic-appearing.     Comments: Pleasant female appearing stated age is found sitting in chair in no acute distress.  HENT:     Head: Normocephalic and atraumatic.     Mouth/Throat:     Lips: Pink.     Mouth: Mucous membranes are moist.  Eyes:     Conjunctiva/sclera: Conjunctivae normal.  Cardiovascular:     Rate and Rhythm: Normal rate and regular rhythm.     Heart sounds: Normal heart sounds. No  murmur heard. Pulmonary:     Effort: Pulmonary effort is normal. No respiratory distress.     Breath  sounds: Normal breath sounds.  Abdominal:     General: Abdomen is flat. Bowel sounds are normal.     Palpations: Abdomen is soft.     Tenderness: There is no abdominal tenderness. There is no guarding.  Musculoskeletal:        General: No swelling.  Skin:    General: Skin is warm and dry.     Capillary Refill: Capillary refill takes less than 2 seconds.  Neurological:     Mental Status: She is alert.  Psychiatric:        Mood and Affect: Mood normal.      UC Treatments / Results  Labs (all labs ordered are listed, but only abnormal results are displayed) Labs Reviewed - No data to display  EKG   Radiology No results found.  Procedures Procedures (including critical care time)  Medications Ordered in UC Medications  ondansetron  (ZOFRAN -ODT) disintegrating tablet 4 mg (4 mg Oral Given 10/21/24 1836)    Initial Impression / Assessment and Plan / UC Course  I have reviewed the triage vital signs and the nursing notes.  Pertinent labs & imaging results that were available during my care of the patient were reviewed by me and considered in my medical decision making (see chart for details).     Vitals and triage reviewed, patient is hemodynamically stable.  Suspect viral gastroenteritis.  She denies fever, abdominal pain, urinary symptoms.  She was able to tolerate crackers and ginger ale while waiting in the lobby.  Provided ondansetron  dose in clinic.  She is discharged with prescription for ondansetron .  Plan of care, follow-up care, return precautions given, no questions at this time.  She is provided with a work note. Final Clinical Impressions(s) / UC Diagnoses   Final diagnoses:  Nausea vomiting and diarrhea  Viral gastroenteritis     Discharge Instructions      Viral gastroenteritis or stomach bug. You have been provided a prescription for ondansetron .  Take 1 tablet every 8 hours as needed for nausea and vomiting. If you continue with watery stool, you can  take Imodium for diarrhea (1 tablet after each loose stool, no more than 4 tablets in 4 hours). Drink plenty of fluids to stay hydrated. Start by eating foods that are easy to digest such as crackers, toast, rice, bananas, applesauce.  Advance your diet as tolerated. If you develop any new or worsening symptoms or if your symptoms do not start to improve, please return here or follow-up with your primary care provider.  If your symptoms are severe, please go to the emergency room.     ED Prescriptions     Medication Sig Dispense Auth. Provider   ondansetron  (ZOFRAN -ODT) 4 MG disintegrating tablet Take 1 tablet (4 mg total) by mouth every 8 (eight) hours as needed for nausea or vomiting. 10 tablet Bhargav Barbaro C, FNP      PDMP not reviewed this encounter.   Lennice Jon BROCKS, FNP 10/21/24 [phone]

## 2024-10-21 NOTE — Discharge Instructions (Addendum)
 Viral gastroenteritis or stomach bug. You have been provided a prescription for ondansetron .  Take 1 tablet every 8 hours as needed for nausea and vomiting. If you continue with watery stool, you can take Imodium for diarrhea (1 tablet after each loose stool, no more than 4 tablets in 4 hours). Drink plenty of fluids to stay hydrated. Start by eating foods that are easy to digest such as crackers, toast, rice, bananas, applesauce.  Advance your diet as tolerated. If you develop any new or worsening symptoms or if your symptoms do not start to improve, please return here or follow-up with your primary care provider.  If your symptoms are severe, please go to the emergency room.

## 2024-10-27 ENCOUNTER — Ambulatory Visit (INDEPENDENT_AMBULATORY_CARE_PROVIDER_SITE_OTHER): Admitting: Primary Care

## 2024-10-27 ENCOUNTER — Encounter (INDEPENDENT_AMBULATORY_CARE_PROVIDER_SITE_OTHER): Payer: Self-pay | Admitting: Primary Care

## 2024-10-27 VITALS — BP 142/92 | HR 87 | Resp 16 | Wt 149.2 lb

## 2024-10-27 DIAGNOSIS — Z760 Encounter for issue of repeat prescription: Secondary | ICD-10-CM

## 2024-10-27 DIAGNOSIS — I10 Essential (primary) hypertension: Secondary | ICD-10-CM

## 2024-10-27 DIAGNOSIS — D509 Iron deficiency anemia, unspecified: Secondary | ICD-10-CM

## 2024-10-27 DIAGNOSIS — Z59819 Housing instability, housed unspecified: Secondary | ICD-10-CM

## 2024-10-27 DIAGNOSIS — Z1231 Encounter for screening mammogram for malignant neoplasm of breast: Secondary | ICD-10-CM | POA: Diagnosis not present

## 2024-10-27 DIAGNOSIS — H539 Unspecified visual disturbance: Secondary | ICD-10-CM | POA: Diagnosis not present

## 2024-10-27 DIAGNOSIS — Z5912 Inadequate housing utilities: Secondary | ICD-10-CM | POA: Diagnosis not present

## 2024-10-27 MED ORDER — AMLODIPINE BESYLATE 10 MG PO TABS: 10.0000 mg | ORAL_TABLET | Freq: Every day | ORAL | 1 refills | Status: AC

## 2024-10-27 MED ORDER — HYDROCHLOROTHIAZIDE 25 MG PO TABS: 25.0000 mg | ORAL_TABLET | Freq: Every day | ORAL | 1 refills | Status: AC

## 2024-10-27 NOTE — Patient Instructions (Signed)
 Hypertension, Adult Hypertension is another name for high blood pressure. High blood pressure forces your heart to work harder to pump blood. This can cause problems over time. There are two numbers in a blood pressure reading. There is a top number (systolic) over a bottom number (diastolic). It is best to have a blood pressure that is below 120/80. What are the causes? The cause of this condition is not known. Some other conditions can lead to high blood pressure. What increases the risk? Some lifestyle factors can make you more likely to develop high blood pressure: Smoking. Not getting enough exercise or physical activity. Being overweight. Having too much fat, sugar, calories, or salt (sodium) in your diet. Drinking too much alcohol. Other risk factors include: Having any of these conditions: Heart disease. Diabetes. High cholesterol. Kidney disease. Obstructive sleep apnea. Having a family history of high blood pressure and high cholesterol. Age. The risk increases with age. Stress. What are the signs or symptoms? High blood pressure may not cause symptoms. Very high blood pressure (hypertensive crisis) may cause: Headache. Fast or uneven heartbeats (palpitations). Shortness of breath. Nosebleed. Vomiting or feeling like you may vomit (nauseous). Changes in how you see. Very bad chest pain. Feeling dizzy. Seizures. How is this treated? This condition is treated by making healthy lifestyle changes, such as: Eating healthy foods. Exercising more. Drinking less alcohol. Your doctor may prescribe medicine if lifestyle changes do not help enough and if: Your top number is above 130. Your bottom number is above 80. Your personal target blood pressure may vary. Follow these instructions at home: Eating and drinking  If told, follow the DASH eating plan. To follow this plan: Fill one half of your plate at each meal with fruits and vegetables. Fill one fourth of your plate  at each meal with whole grains. Whole grains include whole-wheat pasta, brown rice, and whole-grain bread. Eat or drink low-fat dairy products, such as skim milk or low-fat yogurt. Fill one fourth of your plate at each meal with low-fat (lean) proteins. Low-fat proteins include fish, chicken without skin, eggs, beans, and tofu. Avoid fatty meat, cured and processed meat, or chicken with skin. Avoid pre-made or processed food. Limit the amount of salt in your diet to less than 1,500 mg each day. Do not drink alcohol if: Your doctor tells you not to drink. You are pregnant, may be pregnant, or are planning to become pregnant. If you drink alcohol: Limit how much you have to: 0-1 drink a day for women. 0-2 drinks a day for men. Know how much alcohol is in your drink. In the [place] one drink equals one 12 oz bottle of beer (355 mL), one 5 oz glass of wine (148 mL), or one 1 oz glass of hard liquor (44 mL). Lifestyle  Work with your doctor to stay at a healthy weight or to lose weight. Ask your doctor what the best weight is for you. Get at least 30 minutes of exercise that causes your heart to beat faster (aerobic exercise) most days of the week. This may include walking, swimming, or biking. Get at least 30 minutes of exercise that strengthens your muscles (resistance exercise) at least 3 days a week. This may include lifting weights or doing Pilates. Do not smoke or use any products that contain nicotine or tobacco. If you need help quitting, ask your doctor. Check your blood pressure at home as told by your doctor. Keep all follow-up visits. Medicines Take over-the-counter and prescription medicines  only as told by your doctor. Follow directions carefully. Do not skip doses of blood pressure medicine. The medicine does not work as well if you skip doses. Skipping doses also puts you at risk for problems. Ask your doctor about side effects or reactions to medicines that you should watch  for. Contact a doctor if: You think you are having a reaction to the medicine you are taking. You have headaches that keep coming back. You feel dizzy. You have swelling in your ankles. You have trouble with your vision. Get help right away if: You get a very bad headache. You start to feel mixed up (confused). You feel weak or numb. You feel faint. You have very bad pain in your: Chest. Belly (abdomen). You vomit more than once. You have trouble breathing. These symptoms may be an emergency. Get help right away. Call 911. Do not wait to see if the symptoms will go away. Do not drive yourself to the hospital. Summary Hypertension is another name for high blood pressure. High blood pressure forces your heart to work harder to pump blood. For most people, a normal blood pressure is less than 120/80. Making healthy choices can help lower blood pressure. If your blood pressure does not get lower with healthy choices, you may need to take medicine. This information is not intended to replace advice given to you by your health care provider. Make sure you discuss any questions you have with your health care provider. Document Revised: 08/25/2021 Document Reviewed: 08/25/2021 Elsevier Patient Education  2024 [employer].

## 2024-10-27 NOTE — Progress Notes (Signed)
 Subjective:  [name] is a 43 y.o. female presents for ED follow up 10/21/24 Nausea vomiting and diarrhea Viral gastroenteritis . Purchased bad shrimp at the grocery store- washed them felt they were slimy and green on a couple. Disreguarded due to a taste for shrimp. Ran out of BP medications refilled not on them at this visit.  Past Medical History:  Diagnosis Date   [redacted] weeks gestation of pregnancy    Advanced maternal age in multigravida, second trimester    Depression    Fatigue    Headaches due to old head injury    Palpitation    SOB (shortness of breath)    Syncope      No Known Allergies  Current Outpatient Medications on File Prior to Visit  Medication Sig Dispense Refill   amLODipine  (NORVASC ) 10 MG tablet Take 1 tablet (10 mg total) by mouth daily. 90 tablet 1   Blood Pressure Monitor KIT 1 kit by Does not apply route 3 (three) times daily. 1 kit 0   hydrochlorothiazide  (HYDRODIURIL ) 25 MG tablet Take 1 tablet (25 mg total) by mouth daily. 90 tablet 1   ondansetron  (ZOFRAN -ODT) 4 MG disintegrating tablet Take 1 tablet (4 mg total) by mouth every 8 (eight) hours as needed for nausea or vomiting. 10 tablet 0   terbinafine  (LAMISIL ) 250 MG tablet Take 1 tablet (250 mg total) by mouth daily. 90 tablet 0   No current facility-administered medications on file prior to visit.    Review of System: ROS Comprehensive ROS Pertinent positive and negative noted in HPI   Objective:  BP (!) 144/96 (BP Location: Left Arm, Patient Position: Sitting, Cuff Size: Normal)   Pulse 87   Resp 16   Wt 149 lb 3.2 oz (67.7 kg)   LMP 10/06/2024 (Approximate)   SpO2 100%   BMI 26.43 kg/m   Filed Weights   10/27/24 1453  Weight: 149 lb 3.2 oz (67.7 kg)    Physical Exam Vitals reviewed.  Constitutional:      Appearance: Normal appearance.  HENT:     Head: Normocephalic.     Right Ear: Tympanic membrane, ear canal and external ear normal.     Left Ear: Tympanic  membrane, ear canal and external ear normal.     Nose: Nose normal.     Mouth/Throat:     Mouth: Mucous membranes are moist.  Eyes:     Extraocular Movements: Extraocular movements intact.     Pupils: Pupils are equal, round, and reactive to light.  Cardiovascular:     Rate and Rhythm: Normal rate.  Pulmonary:     Effort: Pulmonary effort is normal.     Breath sounds: Normal breath sounds.  Abdominal:     General: Bowel sounds are normal.     Palpations: Abdomen is soft.  Musculoskeletal:        General: Normal range of motion.     Cervical back: Normal range of motion.  Skin:    General: Skin is warm and dry.  Neurological:     Mental Status: She is alert and oriented to person, place, and time.  Psychiatric:        Mood and Affect: Mood normal.        Behavior: Behavior normal.        Thought Content: Thought content normal.      Assessment:  Morgan Lee was seen today for hospitalization follow-up.  Diagnoses and all orders for this visit:  Hypertension, unspecified  type BP goal - < 130/80 Explained that having normal blood pressure is the goal and medications are helping to get to goal and maintain normal blood pressure. DIET: Limit salt intake, read nutrition labels to check salt content, limit fried and high fatty foods  Avoid using multisymptom OTC cold preparations that generally contain sudafed which can rise BP. Consult with pharmacist on best cold relief products to use for persons with HTN EXERCISE Discussed incorporating exercise such as walking - 30 minutes most days of the week and can do in 10 minute intervals    -     hydrochlorothiazide  (HYDRODIURIL ) 25 MG tablet; Take 1 tablet (25 mg total) by mouth daily. -     amLODipine  (NORVASC ) 10 MG tablet; Take 1 tablet (10 mg total) by mouth daily. -     CMP14+EGFR; Future  Iron  deficiency anemia, unspecified iron  deficiency anemia type Anemia is a condition in which you lack enough healthy red blood cells to carry  adequate oxygen to your body's tissues.  -     CBC with Differential/Platelet; Future  Encounter for screening mammogram for malignant neoplasm of breast -     Cancel: MM 3D SCREENING MAMMOGRAM BILATERAL BREAST; Future -     MM 3D SCREENING MAMMOGRAM BILATERAL BREAST; Future  Medication refill -     hydrochlorothiazide  (HYDRODIURIL ) 25 MG tablet; Take 1 tablet (25 mg total) by mouth daily. -     amLODipine  (NORVASC ) 10 MG tablet; Take 1 tablet (10 mg total) by mouth daily.  Housing insecurity -     AMB Referral [facility]  Inadequate housing utilities -     AMB Referral [facility] Management  Visual changes -     Ambulatory referral to Ophthalmology     This note has been created with Dragon speech recognition software and paediatric nurse. Any transcriptional errors are unintentional.   No follow-ups on file.  Morgan SHAUNNA Bohr, NP 10/27/2024, 3:54 PM

## 2024-11-17 ENCOUNTER — Ambulatory Visit (INDEPENDENT_AMBULATORY_CARE_PROVIDER_SITE_OTHER): Admitting: Primary Care

## 2024-11-18 ENCOUNTER — Telehealth: Payer: Self-pay

## 2024-11-18 NOTE — Progress Notes (Signed)
 Complex Care Management Note Care Guide Note  11/18/2024 Name: Morgan Lee MRN: [id number] DOB: 1981-08-26  Morgan Lee is a 43 y.o. year old female who is a primary care patient of Celestia Rosaline SQUIBB, NP . The community resource team was consulted for assistance with Food Insecurity and Financial Difficulties related to utilities and housing.  SDOH screenings and interventions completed:  Yes  Social Drivers of Health From This Encounter   Food Insecurity: No Food Insecurity (11/18/2024)   Epic    Worried About Programme Researcher, Broadcasting/film/video in the Last Year: Never true    Ran Out of Food in the Last Year: Never true  Housing: Low Risk (11/18/2024)   Epic    Unable to Pay for Housing in the Last Year: No    Number of Times Moved in the Last Year: 0    Homeless in the Last Year: No  Financial Resource Strain: High Risk (11/18/2024)   Overall Financial Resource Strain (CARDIA)    Difficulty of Paying Living Expenses: Hard  Transportation Needs: No Transportation Needs (11/18/2024)   Epic    Lack of Transportation (Medical): No    Lack of Transportation (Non-Medical): No  Utilities: Not At Risk (11/18/2024)   Epic    Threatened with loss of utilities: No    SDOH Interventions Today    Flowsheet Row Most Recent Value  SDOH Interventions   Food Insecurity Interventions Other (Comment)  [Per patient request emailed information for [employer], [employer] and [employer].]  Housing Interventions Other (Comment)  [Per patient request emailed information for [employer] [place].]  Utilities Interventions Other (Comment)  [Per patient request emailed informatio for [employer] [place], [facility] [employer] [facility] Assistance and [employer] program.]     Care guide performed the following interventions: Patient provided with information about care guide support team and interviewed to confirm resource  needs.  Follow Up Plan:  No further follow up planned at this time. The patient has been provided with needed resources.  Encounter Outcome:  Patient Visit Completed  Colleen Kotlarz [facility]  [facility] Guide Direct Dial: [phone]  Fax: [phone] Website: [email or web address]

## 2024-11-27 ENCOUNTER — Ambulatory Visit

## 2024-11-28 ENCOUNTER — Ambulatory Visit

## 2024-12-15 ENCOUNTER — Ambulatory Visit

## 2024-12-22 ENCOUNTER — Ambulatory Visit

## 2025-01-06 ENCOUNTER — Ambulatory Visit
